# Patient Record
Sex: Female | Born: 1950
Health system: Southern US, Community
[De-identification: ages and names within clinical notes are randomized; demographics above are authoritative.]

## PROBLEM LIST (undated history)

## (undated) DIAGNOSIS — M1990 Unspecified osteoarthritis, unspecified site: Secondary | ICD-10-CM

## (undated) DIAGNOSIS — I10 Essential (primary) hypertension: Secondary | ICD-10-CM

## (undated) DIAGNOSIS — Z923 Personal history of irradiation: Secondary | ICD-10-CM

## (undated) HISTORY — PX: ABDOMINAL HYSTERECTOMY: SHX81

## (undated) HISTORY — PX: CHOLECYSTECTOMY: SHX55

---

## 1998-09-07 ENCOUNTER — Other Ambulatory Visit: Admission: RE | Admit: 1998-09-07 | Discharge: 1998-09-07 | Payer: Self-pay | Admitting: Obstetrics and Gynecology

## 1999-08-16 ENCOUNTER — Encounter: Payer: Self-pay | Admitting: Internal Medicine

## 1999-08-16 ENCOUNTER — Encounter: Admission: RE | Admit: 1999-08-16 | Discharge: 1999-08-16 | Payer: Self-pay | Admitting: Internal Medicine

## 1999-09-10 ENCOUNTER — Other Ambulatory Visit: Admission: RE | Admit: 1999-09-10 | Discharge: 1999-09-10 | Payer: Self-pay | Admitting: Obstetrics and Gynecology

## 2000-10-02 ENCOUNTER — Other Ambulatory Visit: Admission: RE | Admit: 2000-10-02 | Discharge: 2000-10-02 | Payer: Self-pay | Admitting: Obstetrics and Gynecology

## 2000-10-08 ENCOUNTER — Encounter: Payer: Self-pay | Admitting: Obstetrics and Gynecology

## 2000-10-08 ENCOUNTER — Encounter: Admission: RE | Admit: 2000-10-08 | Discharge: 2000-10-08 | Payer: Self-pay | Admitting: Obstetrics and Gynecology

## 2001-12-21 ENCOUNTER — Encounter: Payer: Self-pay | Admitting: Obstetrics and Gynecology

## 2001-12-21 ENCOUNTER — Encounter: Admission: RE | Admit: 2001-12-21 | Discharge: 2001-12-21 | Payer: Self-pay | Admitting: Obstetrics and Gynecology

## 2002-01-20 ENCOUNTER — Encounter: Admission: RE | Admit: 2002-01-20 | Discharge: 2002-01-20 | Payer: Self-pay | Admitting: Internal Medicine

## 2003-01-25 ENCOUNTER — Other Ambulatory Visit: Admission: RE | Admit: 2003-01-25 | Discharge: 2003-01-25 | Payer: Self-pay | Admitting: Obstetrics and Gynecology

## 2003-05-24 ENCOUNTER — Encounter: Payer: Self-pay | Admitting: Obstetrics and Gynecology

## 2003-05-24 ENCOUNTER — Ambulatory Visit (HOSPITAL_COMMUNITY): Admission: RE | Admit: 2003-05-24 | Discharge: 2003-05-24 | Payer: Self-pay | Admitting: Obstetrics and Gynecology

## 2004-02-02 ENCOUNTER — Other Ambulatory Visit: Admission: RE | Admit: 2004-02-02 | Discharge: 2004-02-02 | Payer: Self-pay | Admitting: Obstetrics and Gynecology

## 2004-02-08 ENCOUNTER — Ambulatory Visit (HOSPITAL_COMMUNITY): Admission: RE | Admit: 2004-02-08 | Discharge: 2004-02-08 | Payer: Self-pay | Admitting: Obstetrics and Gynecology

## 2005-02-06 ENCOUNTER — Other Ambulatory Visit: Admission: RE | Admit: 2005-02-06 | Discharge: 2005-02-06 | Payer: Self-pay | Admitting: Obstetrics and Gynecology

## 2009-10-05 ENCOUNTER — Emergency Department (HOSPITAL_COMMUNITY): Admission: EM | Admit: 2009-10-05 | Discharge: 2009-10-05 | Payer: Self-pay | Admitting: Emergency Medicine

## 2010-05-30 ENCOUNTER — Encounter: Admission: RE | Admit: 2010-05-30 | Discharge: 2010-05-30 | Payer: Self-pay | Admitting: Otolaryngology

## 2010-11-10 ENCOUNTER — Encounter: Payer: Self-pay | Admitting: Obstetrics and Gynecology

## 2011-01-13 ENCOUNTER — Other Ambulatory Visit: Payer: Self-pay | Admitting: Internal Medicine

## 2011-01-13 DIAGNOSIS — R634 Abnormal weight loss: Secondary | ICD-10-CM

## 2011-01-13 DIAGNOSIS — R102 Pelvic and perineal pain: Secondary | ICD-10-CM

## 2011-01-14 ENCOUNTER — Ambulatory Visit
Admission: RE | Admit: 2011-01-14 | Discharge: 2011-01-14 | Disposition: A | Discharge status: Home / Self Care | Payer: BC Managed Care – PPO | Source: Ambulatory Visit | Attending: Internal Medicine | Admitting: Internal Medicine

## 2011-01-14 DIAGNOSIS — R634 Abnormal weight loss: Secondary | ICD-10-CM

## 2011-01-14 DIAGNOSIS — R102 Pelvic and perineal pain: Secondary | ICD-10-CM

## 2011-01-14 MED ORDER — IOHEXOL 300 MG/ML  SOLN
100.0000 mL | Freq: Once | INTRAMUSCULAR | Status: AC | PRN
  Administered 2011-01-14: 100 mL via INTRAVENOUS

## 2011-02-02 ENCOUNTER — Other Ambulatory Visit: Payer: Self-pay | Admitting: General Surgery

## 2011-02-03 ENCOUNTER — Ambulatory Visit (HOSPITAL_COMMUNITY): Payer: BC Managed Care – PPO

## 2011-02-03 ENCOUNTER — Ambulatory Visit (HOSPITAL_COMMUNITY)
Admission: RE | Admit: 2011-02-03 | Discharge: 2011-02-03 | Disposition: A | Discharge status: Home / Self Care | Payer: BC Managed Care – PPO | Source: Ambulatory Visit | Attending: General Surgery | Admitting: General Surgery

## 2011-02-03 DIAGNOSIS — M069 Rheumatoid arthritis, unspecified: Secondary | ICD-10-CM | POA: Insufficient documentation

## 2011-02-03 DIAGNOSIS — Z79899 Other long term (current) drug therapy: Secondary | ICD-10-CM | POA: Insufficient documentation

## 2011-02-03 DIAGNOSIS — K8010 Calculus of gallbladder with chronic cholecystitis without obstruction: Secondary | ICD-10-CM | POA: Insufficient documentation

## 2011-02-03 DIAGNOSIS — D72819 Decreased white blood cell count, unspecified: Secondary | ICD-10-CM | POA: Insufficient documentation

## 2011-02-03 DIAGNOSIS — D638 Anemia in other chronic diseases classified elsewhere: Secondary | ICD-10-CM | POA: Insufficient documentation

## 2011-02-03 DIAGNOSIS — IMO0002 Reserved for concepts with insufficient information to code with codable children: Secondary | ICD-10-CM | POA: Insufficient documentation

## 2011-02-03 LAB — DIFFERENTIAL
Basophils Absolute: 0 10*3/uL (ref 0.0–0.1)
Basophils Relative: 0 % (ref 0–1)
Monocytes Relative: 5 % (ref 3–12)
Neutro Abs: 4.4 10*3/uL (ref 1.7–7.7)
Neutrophils Relative %: 63 % (ref 43–77)

## 2011-02-03 LAB — CBC
Hemoglobin: 12.6 g/dL (ref 12.0–15.0)
RBC: 3.91 MIL/uL (ref 3.87–5.11)
WBC: 6.9 10*3/uL (ref 4.0–10.5)

## 2011-02-03 LAB — COMPREHENSIVE METABOLIC PANEL
Albumin: 3.7 g/dL (ref 3.5–5.2)
Alkaline Phosphatase: 72 U/L (ref 39–117)
BUN: 13 mg/dL (ref 6–23)
Calcium: 9.5 mg/dL (ref 8.4–10.5)
Creatinine, Ser: 0.74 mg/dL (ref 0.4–1.2)
Glucose, Bld: 123 mg/dL — ABNORMAL HIGH (ref 70–99)
Potassium: 4.8 mEq/L (ref 3.5–5.1)
Total Protein: 8.1 g/dL (ref 6.0–8.3)

## 2011-02-03 LAB — SURGICAL PCR SCREEN
MRSA, PCR: NEGATIVE
Staphylococcus aureus: NEGATIVE

## 2011-02-11 NOTE — Op Note (Signed)
Heather Roberson, FAETH            ACCOUNT NO.:  [id number]  MEDICAL RECORD NO.:  [id number]           PATIENT TYPE:  O  LOCATION:  SDSC                         FACILITY:  [facility]  PHYSICIAN:  Ollen Gross. Vernell Morgans, M.D. DATE OF BIRTH:  1951-10-11  DATE OF PROCEDURE:  02/03/2011 DATE OF DISCHARGE:                              OPERATIVE REPORT   PREOPERATIVE DIAGNOSIS:  Gallstones.  POSTOPERATIVE DIAGNOSIS:  Gallstones.  PROCEDURE:  Laparoscopic cholecystectomy with intraoperative cholangiogram.  SURGEON:  Ollen Gross. Vernell Morgans, MD  ASSISTANT:  Anselm Pancoast. Zachery Dakins, MD  ANESTHESIA:  General endotracheal.  PROCEDURE:  After informed consent was obtained, the patient was brought to the operating room and placed in supine position on the operating room table.  After induction of general anesthesia, the patient's abdomen was prepped with ChloraPrep, allowed to dry and draped in usual sterile manner.  The area below the umbilicus was infiltrated with 0.25% Marcaine.  A small incision was made with a 15 blade knife.  This incision was carried down through the subcutaneous tissue bluntly with a hemostat and [employer] retractors until the linea alba was identified. The linea alba was incised with a 15 blade knife and each side was grasped with Kocher clamps and elevated anteriorly.  The preperitoneal space was then probed bluntly with a hemostat until the peritoneum was opened.  Next, access was gained into the abdominal cavity.  A 0 Vicryl pursestring stitch was placed in the fascia around the opening, a Hasson cannula was placed through the opening and anchored in place with a previously placed Vicryl pursestring stitch.  The abdomen was then insufflated with carbon dioxide without difficulty.  The patient was placed in reverse Trendelenburg position and rotated slightly with the right side up.  Next, the epigastric region was infiltrated with 0.25% Marcaine.  A small incision was made with  a 15 blade knife.  A 10-mm port was placed bluntly through this incision into the abdominal cavity under direct vision.  Sites were then chosen laterally on the right side of the abdomen with placement of 5-mm ports.  Each of this area was then infiltrated with 0.25% Marcaine.  Small stab incisions were made with a 15 blade knife.  5 mm ports were then placed bluntly through these incisions into the abdominal cavity under direct vision without difficulty.  Blunt grasper was placed to the lateral-most 5-mm port and used to grasp the dome of the gallbladder and elevated anteriorly and superiorly.  Another blunt grasper was placed through the other 5-mm port and used to retract on the body and neck of the gallbladder.  A dissector was placed through the epigastric port.  The peritoneal reflection at the gallbladder neck was opened with the electrocautery. Blunt dissection was then carried out in this area until the gallbladder neck and cystic duct junction was readily identified and a good window was created.  A single clip was placed on the gallbladder neck.  A small ductotomy was made just below the clip with laparoscopic scissors.  A 14- gauge Angiocath was placed percutaneously through the anterior abdominal wall under direct vision.  A Reddick cholangiogram catheter  was placed through the Angiocath and flushed.  The Reddick catheter was then placed within the cystic duct and anchored in place with a clip.  A cholangiogram was obtained that showed no filling defects, good emptying in duodenum and adequate length on the cystic duct.  The anchoring clip and the catheters were removed from the patient.  Three clips were placed proximally on the cystic duct and duct was divided between the two sets of clips.  Posteriorly, the cystic artery was identified and again dissected bluntly in circumferential manner until a good window was created.  Two clips were placed proximally and one distally in  the artery and the artery was divided between the two.  Next, a laparoscopic hook cautery device was used to separate the gallbladder from the liver bed.  Prior to completely detaching the gallbladder from the liver bed, the liver bed was inspected and several small bleeding points were coagulated with electrocautery until the area was completely hemostatic. Gallbladder was then detached from rest away from the liver bed without difficulty.  A laparoscopic bag was inserted through the epigastric port.  The gallbladder was placed in the bag and the bag was sealed. The abdomen was then irrigated with copious amounts of saline until the effluent was clear.  The liver bed was inspected again and found to be hemostatic.  The laparoscope was then moved from the epigastric port.  A gallbladder grasper was placed through the Hasson cannula and used to grasp the opening of the bag.  The bag with the gallbladder was removed with the Hasson cannula through the infraumbilical port without difficulty.  The fascial defect was closed with preplaced Vicryl pursestring stitch as well as with another figure-of-eight 0 Vicryl stitch.  The rest of the ports were removed under direct vision and were found to be hemostatic.  Gas was allowed to escape.  The skin incisions were all closed with interrupted 4-0  Monocryl subcuticular stitches and Dermabond dressings were applied.  The patient tolerated the procedure well.  At the end of the case, all needle, sponge and instrument counts were correct.  The patient was then awakened and taken to the recovery room in stable condition.     Ollen Gross. Vernell Morgans, M.D.     PST/MEDQ  D:  02/03/2011  T:  02/03/2011  Job:  [id number]  Electronically Signed by Chevis Pretty III M.D. on 02/11/2011 09:30:32 AM

## 2011-02-18 ENCOUNTER — Other Ambulatory Visit: Payer: Self-pay | Admitting: General Surgery

## 2011-02-18 ENCOUNTER — Ambulatory Visit
Admission: RE | Admit: 2011-02-18 | Discharge: 2011-02-18 | Disposition: A | Discharge status: Home / Self Care | Payer: BC Managed Care – PPO | Source: Ambulatory Visit | Attending: General Surgery | Admitting: General Surgery

## 2011-02-18 DIAGNOSIS — R0602 Shortness of breath: Secondary | ICD-10-CM

## 2013-11-15 ENCOUNTER — Other Ambulatory Visit: Payer: Self-pay | Admitting: Nurse Practitioner

## 2013-11-15 ENCOUNTER — Ambulatory Visit
Admission: RE | Admit: 2013-11-15 | Discharge: 2013-11-15 | Disposition: A | Discharge status: Home / Self Care | Payer: BC Managed Care – PPO | Source: Ambulatory Visit | Attending: Nurse Practitioner | Admitting: Nurse Practitioner

## 2013-11-15 DIAGNOSIS — R634 Abnormal weight loss: Secondary | ICD-10-CM

## 2013-11-17 ENCOUNTER — Other Ambulatory Visit: Payer: Self-pay | Admitting: Nurse Practitioner

## 2013-11-17 DIAGNOSIS — R413 Other amnesia: Secondary | ICD-10-CM

## 2013-11-21 ENCOUNTER — Ambulatory Visit
Admission: RE | Admit: 2013-11-21 | Discharge: 2013-11-21 | Disposition: A | Discharge status: Home / Self Care | Payer: BC Managed Care – PPO | Source: Ambulatory Visit | Attending: Nurse Practitioner | Admitting: Nurse Practitioner

## 2013-11-21 ENCOUNTER — Other Ambulatory Visit: Payer: BC Managed Care – PPO

## 2013-11-21 DIAGNOSIS — R413 Other amnesia: Secondary | ICD-10-CM

## 2013-11-21 MED ORDER — GADOBENATE DIMEGLUMINE 529 MG/ML IV SOLN
9.0000 mL | Freq: Once | INTRAVENOUS | Status: AC | PRN
  Administered 2013-11-21: 9 mL via INTRAVENOUS

## 2014-12-14 ENCOUNTER — Other Ambulatory Visit: Payer: Self-pay | Admitting: Internal Medicine

## 2014-12-14 DIAGNOSIS — R29810 Facial weakness: Secondary | ICD-10-CM

## 2015-01-01 ENCOUNTER — Other Ambulatory Visit: Payer: Self-pay | Admitting: Obstetrics and Gynecology

## 2015-01-02 LAB — CYTOLOGY - PAP

## 2016-03-27 ENCOUNTER — Emergency Department (HOSPITAL_COMMUNITY): Payer: BLUE CROSS/BLUE SHIELD

## 2016-03-27 ENCOUNTER — Emergency Department (HOSPITAL_COMMUNITY)
Admission: EM | Admit: 2016-03-27 | Discharge: 2016-03-27 | Disposition: A | Discharge status: Home / Self Care | Payer: BLUE CROSS/BLUE SHIELD | Attending: Emergency Medicine | Admitting: Emergency Medicine

## 2016-03-27 ENCOUNTER — Encounter (HOSPITAL_COMMUNITY): Payer: Self-pay | Admitting: Family Medicine

## 2016-03-27 DIAGNOSIS — Z79899 Other long term (current) drug therapy: Secondary | ICD-10-CM | POA: Insufficient documentation

## 2016-03-27 DIAGNOSIS — R42 Dizziness and giddiness: Secondary | ICD-10-CM | POA: Diagnosis present

## 2016-03-27 HISTORY — DX: Unspecified osteoarthritis, unspecified site: M19.90

## 2016-03-27 LAB — I-STAT TROPONIN, ED: TROPONIN I, POC: 0 ng/mL (ref 0.00–0.08)

## 2016-03-27 LAB — URINE MICROSCOPIC-ADD ON: RBC / HPF: NONE SEEN RBC/hpf (ref 0–5)

## 2016-03-27 LAB — CBC
HCT: 39.2 % (ref 36.0–46.0)
HEMOGLOBIN: 13 g/dL (ref 12.0–15.0)
MCH: 31.3 pg (ref 26.0–34.0)
MCHC: 33.2 g/dL (ref 30.0–36.0)
MCV: 94.2 fL (ref 78.0–100.0)
Platelets: 184 10*3/uL (ref 150–400)
RBC: 4.16 MIL/uL (ref 3.87–5.11)
RDW: 12.3 % (ref 11.5–15.5)
WBC: 4.9 10*3/uL (ref 4.0–10.5)

## 2016-03-27 LAB — BASIC METABOLIC PANEL
ANION GAP: 8 (ref 5–15)
BUN: 10 mg/dL (ref 6–20)
CALCIUM: 9.3 mg/dL (ref 8.9–10.3)
CO2: 25 mmol/L (ref 22–32)
CREATININE: 0.81 mg/dL (ref 0.44–1.00)
Chloride: 102 mmol/L (ref 101–111)
Glucose, Bld: 121 mg/dL — ABNORMAL HIGH (ref 65–99)
Potassium: 3.5 mmol/L (ref 3.5–5.1)
SODIUM: 135 mmol/L (ref 135–145)

## 2016-03-27 LAB — URINALYSIS, ROUTINE W REFLEX MICROSCOPIC
BILIRUBIN URINE: NEGATIVE
Glucose, UA: NEGATIVE mg/dL
HGB URINE DIPSTICK: NEGATIVE
Ketones, ur: NEGATIVE mg/dL
Nitrite: NEGATIVE
PH: 7 (ref 5.0–8.0)
Protein, ur: NEGATIVE mg/dL
SPECIFIC GRAVITY, URINE: 1.003 — AB (ref 1.005–1.030)

## 2016-03-27 MED ORDER — NYSTATIN 100000 UNIT/ML MT SUSP: 500000.0000 [IU] | Freq: Four times a day (QID) | OROMUCOSAL | Status: AC

## 2016-03-27 NOTE — ED Notes (Signed)
Pt eating fish plate in triage

## 2016-03-27 NOTE — ED Notes (Signed)
Pt here for dizziness, lightheadedness, vomiting that started this am. sts started all of a sudden. No other deficits. sts she has had some improvement. Denies dizziness now. sts that she hasn't had anything to eat this am.

## 2016-03-27 NOTE — Discharge Instructions (Signed)
Ms. Heather Roberson,  Nice meeting you! Please follow-up with your primary care provider. Return to the emergency department if you develop fevers, chills, chest pain, abdominal pain, nausea/vomiting, new/worsening symptoms. Feel better soon!  S. Wendie Simmer, PA-C Dizziness Dizziness is a common problem. It is a feeling of unsteadiness or light-headedness. You may feel like you are about to faint. Dizziness can lead to injury if you stumble or fall. Anyone can become dizzy, but dizziness is more common in older adults. This condition can be caused by a number of things, including medicines, dehydration, or illness. HOME CARE INSTRUCTIONS Taking these steps may help with your condition: Eating and Drinking  Drink enough fluid to keep your urine clear or pale yellow. This helps to keep you from becoming dehydrated. Try to drink more clear fluids, such as water.  Do not drink alcohol.  Limit your caffeine intake if directed by your health care provider.  Limit your salt intake if directed by your health care provider. Activity  Avoid making quick movements.  Rise slowly from chairs and steady yourself until you feel okay.  In the morning, first sit up on the side of the bed. When you feel okay, stand slowly while you hold onto something until you know that your balance is fine.  Move your legs often if you need to stand in one place for a long time. Tighten and relax your muscles in your legs while you are standing.  Do not drive or operate heavy machinery if you feel dizzy.  Avoid bending down if you feel dizzy. Place items in your home so that they are easy for you to reach without leaning over. Lifestyle  Do not use any tobacco products, including cigarettes, chewing tobacco, or electronic cigarettes. If you need help quitting, ask your health care provider.  Try to reduce your stress level, such as with yoga or meditation. Talk with your health care provider if you need  help. General Instructions  Watch your dizziness for any changes.  Take medicines only as directed by your health care provider. Talk with your health care provider if you think that your dizziness is caused by a medicine that you are taking.  Tell a friend or a family member that you are feeling dizzy. If he or she notices any changes in your behavior, have this person call your health care provider.  Keep all follow-up visits as directed by your health care provider. This is important. SEEK MEDICAL CARE IF:  Your dizziness does not go away.  Your dizziness or light-headedness gets worse.  You feel nauseous.  You have reduced hearing.  You have new symptoms.  You are unsteady on your feet or you feel like the room is spinning. SEEK IMMEDIATE MEDICAL CARE IF:  You vomit or have diarrhea and are unable to eat or drink anything.  You have problems talking, walking, swallowing, or using your arms, hands, or legs.  You feel generally weak.  You are not thinking clearly or you have trouble forming sentences. It may take a friend or family member to notice this.  You have chest pain, abdominal pain, shortness of breath, or sweating.  Your vision changes.  You notice any bleeding.  You have a headache.  You have neck pain or a stiff neck.  You have a fever.   This information is not intended to replace advice given to you by your health care provider. Make sure you discuss any questions you have with your  health care provider.   Document Released: 04/01/2001 Document Revised: 02/20/2015 Document Reviewed: 10/02/2014 Elsevier Interactive Patient Education [employer].

## 2016-03-27 NOTE — ED Notes (Signed)
Pt is in restroom 

## 2016-03-27 NOTE — ED Provider Notes (Signed)
Medical screening examination/treatment/procedure(s) were conducted as a shared visit with non-physician practitioner(s) and myself.  I personally evaluated the patient during the encounter.   EKG Interpretation   Date/Time:  Thursday March 27 2016 12:06:57 EDT Ventricular Rate:  83 PR Interval:  170 QRS Duration: 94 QT Interval:  440 QTC Calculation: 517 R Axis:   83 Text Interpretation:  Normal sinus rhythm Moderate voltage criteria for  LVH, may be normal variant Nonspecific T wave abnormality Abnormal ECG No  significant change since last tracing Confirmed by Timiya Howells  MD, Aubrina Nieman  ([id number]) on 03/27/2016 6:28:20 PM     Patient here after acute onset of dizziness while walking uphill was lasted for approximately 1 hour. No associated chest pain or chest pressure. States he feels much better at this time. Denies any recent illnesses. No blood loss. Did note that she did not have any food today. Labs and x-rays reviewed. Patient stable for discharge with return precautions   Lacretia Leigh, MD 03/27/16 2009

## 2016-03-27 NOTE — ED Notes (Signed)
Phlebotomy at bedside.

## 2016-03-27 NOTE — ED Provider Notes (Signed)
CSN: [id number]     Arrival date & time 03/27/16  1137 History   First MD Initiated Contact with Patient 03/27/16 1659     Chief Complaint  Patient presents with  . Dizziness  . Emesis   HPI   ALEKSANDRIA FRIGO is a 65 y.o. female PMH significant for arthritis presenting with s/p one episode of dizziness this morning. She states the dizziness occurred while walking uphill. She states that episode lasted approximately 1 hour. She endorses nausea and one episode of vomiting prior to arrival. She ate food while in the waiting room without emesis. She describes the dizziness as nonvertiginous. She denies any shortness of breath, chest pain, abdominal pain, hematochezia, headaches, visual changes, slurred speech, weakness, dysuria. She attributes her symptoms to not eating this morning.  Past Medical History  Diagnosis Date  . Arthritis    History reviewed. No pertinent past surgical history. History reviewed. No pertinent family history. Social History  Substance Use Topics  . Smoking status: Never Smoker   . Smokeless tobacco: None  . Alcohol Use: No   OB History    No data available     Review of Systems  Ten systems are reviewed and are negative for acute change except as noted in the HPI  Allergies  Other  Home Medications   Prior to Admission medications   Medication Sig Start Date End Date Taking? Authorizing Provider  Ascorbic Acid (VITAMIN C PO) Take 1 tablet by mouth daily.   Yes Historical Provider, MD  CALCIUM PO Take 1 tablet by mouth daily.   Yes Historical Provider, MD  Carboxymethylcellul-Glycerin (CLEAR EYES FOR DRY EYES OP) Place 1-2 drops into both eyes 2 (two) times daily as needed (for dryness).   Yes Historical Provider, MD  cholecalciferol (VITAMIN D) 1000 units tablet Take 1,000 Units by mouth daily.   Yes Historical Provider, MD  Cyanocobalamin (B-12 PO) Take 1 tablet by mouth daily.   Yes Historical Provider, MD  ferrous sulfate 325 (65 FE) MG tablet  Take 325 mg by mouth daily with breakfast.   Yes Historical Provider, MD  fluticasone (FLONASE) 50 MCG/ACT nasal spray Place 1-2 sprays into both nostrils daily as needed for allergies or rhinitis.   Yes Historical Provider, MD  HUMIRA PEN 40 MG/0.8ML PNKT One injection every other week 02/29/16  Yes Historical Provider, MD  hydroxychloroquine (PLAQUENIL) 200 MG tablet Take 2 tablets by mouth every morning. 02/05/16  Yes Historical Provider, MD  Multiple Vitamins-Minerals (ONE-A-DAY WOMENS 50 PLUS PO) Take 1 tablet by mouth daily after breakfast.   Yes Historical Provider, MD   BP 144/91 mmHg  Pulse 81  Temp(Src) 98.7 F (37.1 C) (Oral)  Resp 17  SpO2 100% Physical Exam  Constitutional: She is oriented to person, place, and time. She appears well-developed and well-nourished. No distress.  HENT:  Head: Normocephalic and atraumatic.  Right Ear: External ear normal.  Left Ear: External ear normal.  Nose: Nose normal.  Mouth/Throat: Oropharynx is clear and moist. No oropharyngeal exudate.  TMs normal bilaterally.  Eyes: Conjunctivae are normal. Pupils are equal, round, and reactive to light. Right eye exhibits no discharge. Left eye exhibits no discharge. No scleral icterus.  Neck: No tracheal deviation present.  Cardiovascular: Normal rate, regular rhythm, normal heart sounds and intact distal pulses.  Exam reveals no gallop and no friction rub.   No murmur heard. Pulmonary/Chest: Effort normal and breath sounds normal. No respiratory distress. She has no wheezes. She has no rales.  She exhibits no tenderness.  Abdominal: Soft. Bowel sounds are normal. She exhibits no distension and no mass. There is no tenderness. There is no rebound and no guarding.  Musculoskeletal: Normal range of motion. She exhibits no edema or tenderness.  Lymphadenopathy:    She has no cervical adenopathy.  Neurological: She is alert and oriented to person, place, and time. Coordination normal.  Cranial nerves II  through XII grossly intact.  Skin: Skin is warm and dry. No rash noted. She is not diaphoretic. No erythema.  Psychiatric: She has a normal mood and affect. Her behavior is normal.  Nursing note and vitals reviewed.   ED Course  Procedures  Labs Review Labs Reviewed  BASIC METABOLIC PANEL - Abnormal; Notable for the following:    Glucose, Bld 121 (*)    All other components within normal limits  URINALYSIS, ROUTINE W REFLEX MICROSCOPIC (NOT AT [facility]) - Abnormal; Notable for the following:    Specific Gravity, Urine 1.003 (*)    Leukocytes, UA SMALL (*)    All other components within normal limits  URINE MICROSCOPIC-ADD ON - Abnormal; Notable for the following:    Squamous Epithelial / LPF 0-5 (*)    Bacteria, UA RARE (*)    All other components within normal limits  CBC  I-STAT TROPOININ, ED    Imaging Review Dg Chest 2 View  03/27/2016  CLINICAL DATA:  65 year old female with dizziness EXAM: CHEST  2 VIEW COMPARISON:  Chest radiograph dated 11/15/2013 FINDINGS: The heart size and mediastinal contours are within normal limits. Both lungs are clear. The visualized skeletal structures are unremarkable. IMPRESSION: No active cardiopulmonary disease. Electronically Signed   By: Anner Crete M.D.   On: 03/27/2016 18:25   Ct Head Wo Contrast  03/27/2016  CLINICAL DATA:  Sudden onset of dizziness this morning, lightheadedness and vomiting. EXAM: CT HEAD WITHOUT CONTRAST TECHNIQUE: Contiguous axial images were obtained from the base of the skull through the vertex without intravenous contrast. COMPARISON:  Brain MRI dated 11/21/2013. FINDINGS: Brain: Ventricles are normal in size and configuration. Mild chronic small vessel ischemic change again noted within the deep periventricular white matter regions bilaterally. All other areas of the brain demonstrate normal gray-white matter attenuation. There is no mass, hemorrhage, edema or other evidence of acute parenchymal abnormality. No  extra-axial hemorrhage. Vascular: No hyperdense vessel or unexpected calcification. Skull: Negative for fracture or focal lesion. Sinuses/Orbits: No acute findings. Visualized upper paranasal sinuses are clear. Other: None. IMPRESSION: No acute findings. No intracranial mass, hemorrhage or edema. Mild chronic small vessel ischemic change within the deep periventricular white matter. Electronically Signed   By: Franki Cabot M.D.   On: 03/27/2016 18:41   I have personally reviewed and evaluated these images and lab results as part of my medical decision-making.   EKG Interpretation   Date/Time:  Thursday March 27 2016 12:06:57 EDT Ventricular Rate:  83 PR Interval:  170 QRS Duration: 94 QT Interval:  440 QTC Calculation: 517 R Axis:   83 Text Interpretation:  Normal sinus rhythm Moderate voltage criteria for  LVH, may be normal variant Nonspecific T wave abnormality Abnormal ECG No  significant change since last tracing Confirmed by ALLEN  MD, ANTHONY  ([id number]) on 03/27/2016 6:28:20 PM      MDM   Final diagnoses:  Dizziness   Troponin, UA, BMP, CBC, CT head, chest x-ray, EKG unremarkable for acute change. Patient was asymptomatic during encounter. She feels better and is requesting to go home. Patient  may be safely discharged home. Discussed reasons for return. Patient to follow-up with primary care provider within one week. Patient in understanding and agreement with the plan.  Dr. Zenia Resides evaluated patient as well and agrees with above plan.   Clitherall Lions, [place] 04/05/16 [phone]

## 2016-03-27 NOTE — ED Notes (Signed)
Pt reports she feels much better

## 2016-06-06 ENCOUNTER — Encounter: Payer: BLUE CROSS/BLUE SHIELD | Admitting: Podiatry

## 2016-06-19 NOTE — Progress Notes (Signed)
This encounter was created in error - please disregard.

## 2016-06-20 ENCOUNTER — Ambulatory Visit (INDEPENDENT_AMBULATORY_CARE_PROVIDER_SITE_OTHER): Payer: BLUE CROSS/BLUE SHIELD

## 2016-06-20 ENCOUNTER — Encounter: Payer: Self-pay | Admitting: Podiatry

## 2016-06-20 ENCOUNTER — Ambulatory Visit (INDEPENDENT_AMBULATORY_CARE_PROVIDER_SITE_OTHER): Payer: BLUE CROSS/BLUE SHIELD | Admitting: Podiatry

## 2016-06-20 VITALS — BP 119/83 | HR 96 | Resp 16 | Ht 63.0 in | Wt 116.0 lb

## 2016-06-20 DIAGNOSIS — M79672 Pain in left foot: Secondary | ICD-10-CM

## 2016-06-20 DIAGNOSIS — D3610 Benign neoplasm of peripheral nerves and autonomic nervous system, unspecified: Secondary | ICD-10-CM

## 2016-06-20 DIAGNOSIS — M79671 Pain in right foot: Secondary | ICD-10-CM | POA: Diagnosis not present

## 2016-06-20 DIAGNOSIS — M779 Enthesopathy, unspecified: Secondary | ICD-10-CM

## 2016-06-20 DIAGNOSIS — G5761 Lesion of plantar nerve, right lower limb: Secondary | ICD-10-CM | POA: Diagnosis not present

## 2016-06-20 NOTE — Progress Notes (Signed)
   Subjective:    Patient ID: Heather Roberson, female    DOB: 1951-04-29, 65 y.o.   MRN: [id number]  HPI    Review of Systems  HENT: Positive for sinus pressure.   Respiratory: Positive for cough.   All other systems reviewed and are negative.      Objective:   Physical Exam        Assessment & Plan:

## 2016-06-20 NOTE — Progress Notes (Signed)
Subjective:     Patient ID: Heather Roberson, female   DOB: 1951-04-12, 65 y.o.   MRN: [id number]  HPI patient presents stating I have pain in both my feet long-term history of rheumatoid arthritis and shooting pains third interspace right over left. States that this is been going on and seems to be getting gradually worse over time   Review of Systems  All other systems reviewed and are negative.      Objective:   Physical Exam  Constitutional: She is oriented to person, place, and time.  Cardiovascular: Intact distal pulses.   Musculoskeletal: Normal range of motion.  Neurological: She is oriented to person, place, and time.  Skin: Skin is warm.  Nursing note and vitals reviewed.  neurovascular status intact muscle strength adequate range of motion within normal limits with patient noted to have thin fat pad with keratotic lesion formation and discomfort with exquisite pain third interspace right over left foot with shooting pains into the adjacent digits. Patient's noted to have good digital perfusion and is well oriented 3     Assessment:     Possibility for neuroma symptomatology right along with arthritis and keratotic lesion formation    Plan:     H&P x-rays reviewed and were to try to focus on the neuroma pain that the patient's experiencing I did a proximal nerve block right then did a purified alcohol solution treatment consisting of 4% alcohol along with Marcaine. Tolerated well and reappoint to recheck  X-ray report indicates that there is some pressure around the metatarsal bones but localized

## 2016-06-20 NOTE — Progress Notes (Signed)
Chief Complaint  Patient presents with  . Foot Pain    shooting / bilateral great toes and plantar forefoot off and on

## 2016-07-18 ENCOUNTER — Encounter: Payer: Self-pay | Admitting: Podiatry

## 2016-07-18 ENCOUNTER — Ambulatory Visit (INDEPENDENT_AMBULATORY_CARE_PROVIDER_SITE_OTHER): Payer: BLUE CROSS/BLUE SHIELD | Admitting: Podiatry

## 2016-07-18 DIAGNOSIS — M79671 Pain in right foot: Secondary | ICD-10-CM | POA: Diagnosis not present

## 2016-07-18 DIAGNOSIS — D3610 Benign neoplasm of peripheral nerves and autonomic nervous system, unspecified: Secondary | ICD-10-CM | POA: Diagnosis not present

## 2016-07-18 DIAGNOSIS — M779 Enthesopathy, unspecified: Secondary | ICD-10-CM | POA: Diagnosis not present

## 2016-07-18 DIAGNOSIS — L84 Corns and callosities: Secondary | ICD-10-CM

## 2016-07-18 DIAGNOSIS — M79672 Pain in left foot: Secondary | ICD-10-CM | POA: Diagnosis not present

## 2016-07-18 NOTE — Progress Notes (Signed)
Subjective:     Patient ID: Heather Roberson, female   DOB: Nov 20, 1950, 65 y.o.   MRN: [id number]  HPI patient presents stating my foot is feeling fine with diminished discomfort and I did lesion left   Review of Systems     Objective:   Physical Exam Neurovascular status intact with patient found to have good healing sites with response to neuro lysis injection was excellent with patient noted to have keratotic lesion formation    Assessment:     Neuroma symptomatology improved with lesion formation bilateral    Plan:     Reviewed condition and recommended that she continue wider shoes and I debrided lesions to reduce pressure

## 2016-12-14 DIAGNOSIS — L03011 Cellulitis of right finger: Secondary | ICD-10-CM | POA: Insufficient documentation

## 2016-12-14 DIAGNOSIS — M79644 Pain in right finger(s): Secondary | ICD-10-CM | POA: Insufficient documentation

## 2017-03-02 DIAGNOSIS — N958 Other specified menopausal and perimenopausal disorders: Secondary | ICD-10-CM | POA: Diagnosis not present

## 2017-03-02 DIAGNOSIS — Z01419 Encounter for gynecological examination (general) (routine) without abnormal findings: Secondary | ICD-10-CM | POA: Diagnosis not present

## 2017-03-02 DIAGNOSIS — M8588 Other specified disorders of bone density and structure, other site: Secondary | ICD-10-CM | POA: Diagnosis not present

## 2017-03-02 DIAGNOSIS — Z1231 Encounter for screening mammogram for malignant neoplasm of breast: Secondary | ICD-10-CM | POA: Diagnosis not present

## 2017-03-02 DIAGNOSIS — Z6822 Body mass index (BMI) 22.0-22.9, adult: Secondary | ICD-10-CM | POA: Diagnosis not present

## 2017-03-05 DIAGNOSIS — M069 Rheumatoid arthritis, unspecified: Secondary | ICD-10-CM | POA: Diagnosis not present

## 2017-03-05 DIAGNOSIS — Z79899 Other long term (current) drug therapy: Secondary | ICD-10-CM | POA: Diagnosis not present

## 2017-04-06 DIAGNOSIS — J322 Chronic ethmoidal sinusitis: Secondary | ICD-10-CM | POA: Diagnosis not present

## 2017-04-21 DIAGNOSIS — R05 Cough: Secondary | ICD-10-CM | POA: Diagnosis not present

## 2017-04-29 DIAGNOSIS — M069 Rheumatoid arthritis, unspecified: Secondary | ICD-10-CM | POA: Diagnosis not present

## 2017-04-29 DIAGNOSIS — K6289 Other specified diseases of anus and rectum: Secondary | ICD-10-CM | POA: Diagnosis not present

## 2017-04-29 DIAGNOSIS — D638 Anemia in other chronic diseases classified elsewhere: Secondary | ICD-10-CM | POA: Diagnosis not present

## 2017-04-29 DIAGNOSIS — R198 Other specified symptoms and signs involving the digestive system and abdomen: Secondary | ICD-10-CM | POA: Diagnosis not present

## 2017-05-01 DIAGNOSIS — Z23 Encounter for immunization: Secondary | ICD-10-CM | POA: Diagnosis not present

## 2017-05-01 DIAGNOSIS — Z1159 Encounter for screening for other viral diseases: Secondary | ICD-10-CM | POA: Diagnosis not present

## 2017-05-01 DIAGNOSIS — R030 Elevated blood-pressure reading, without diagnosis of hypertension: Secondary | ICD-10-CM | POA: Diagnosis not present

## 2017-05-01 DIAGNOSIS — M069 Rheumatoid arthritis, unspecified: Secondary | ICD-10-CM | POA: Diagnosis not present

## 2017-05-01 DIAGNOSIS — M8580 Other specified disorders of bone density and structure, unspecified site: Secondary | ICD-10-CM | POA: Diagnosis not present

## 2017-05-01 DIAGNOSIS — Z0000 Encounter for general adult medical examination without abnormal findings: Secondary | ICD-10-CM | POA: Diagnosis not present

## 2017-06-01 DIAGNOSIS — M359 Systemic involvement of connective tissue, unspecified: Secondary | ICD-10-CM | POA: Diagnosis not present

## 2017-06-01 DIAGNOSIS — Z79899 Other long term (current) drug therapy: Secondary | ICD-10-CM | POA: Diagnosis not present

## 2017-06-01 DIAGNOSIS — M0589 Other rheumatoid arthritis with rheumatoid factor of multiple sites: Secondary | ICD-10-CM | POA: Diagnosis not present

## 2017-06-15 DIAGNOSIS — Z1211 Encounter for screening for malignant neoplasm of colon: Secondary | ICD-10-CM | POA: Diagnosis not present

## 2017-06-15 DIAGNOSIS — K640 First degree hemorrhoids: Secondary | ICD-10-CM | POA: Diagnosis not present

## 2017-10-08 DIAGNOSIS — M0589 Other rheumatoid arthritis with rheumatoid factor of multiple sites: Secondary | ICD-10-CM | POA: Diagnosis not present

## 2017-10-08 DIAGNOSIS — Z79899 Other long term (current) drug therapy: Secondary | ICD-10-CM | POA: Diagnosis not present

## 2017-10-08 DIAGNOSIS — M359 Systemic involvement of connective tissue, unspecified: Secondary | ICD-10-CM | POA: Diagnosis not present

## 2017-11-19 DIAGNOSIS — Z79899 Other long term (current) drug therapy: Secondary | ICD-10-CM | POA: Diagnosis not present

## 2017-11-19 DIAGNOSIS — H524 Presbyopia: Secondary | ICD-10-CM | POA: Diagnosis not present

## 2017-11-19 DIAGNOSIS — H5203 Hypermetropia, bilateral: Secondary | ICD-10-CM | POA: Diagnosis not present

## 2017-11-19 DIAGNOSIS — H2513 Age-related nuclear cataract, bilateral: Secondary | ICD-10-CM | POA: Diagnosis not present

## 2017-11-19 DIAGNOSIS — M069 Rheumatoid arthritis, unspecified: Secondary | ICD-10-CM | POA: Diagnosis not present

## 2017-12-14 ENCOUNTER — Other Ambulatory Visit: Payer: Self-pay | Admitting: Internal Medicine

## 2017-12-14 ENCOUNTER — Ambulatory Visit
Admission: RE | Admit: 2017-12-14 | Discharge: 2017-12-14 | Disposition: A | Discharge status: Home / Self Care | Payer: Medicare HMO | Source: Ambulatory Visit | Attending: Internal Medicine | Admitting: Internal Medicine

## 2017-12-14 DIAGNOSIS — R05 Cough: Secondary | ICD-10-CM

## 2017-12-14 DIAGNOSIS — R053 Chronic cough: Secondary | ICD-10-CM

## 2017-12-31 DIAGNOSIS — R05 Cough: Secondary | ICD-10-CM | POA: Diagnosis not present

## 2018-02-01 DIAGNOSIS — M0589 Other rheumatoid arthritis with rheumatoid factor of multiple sites: Secondary | ICD-10-CM | POA: Diagnosis not present

## 2018-02-01 DIAGNOSIS — M79642 Pain in left hand: Secondary | ICD-10-CM | POA: Diagnosis not present

## 2018-02-01 DIAGNOSIS — Z79899 Other long term (current) drug therapy: Secondary | ICD-10-CM | POA: Diagnosis not present

## 2018-02-01 DIAGNOSIS — M359 Systemic involvement of connective tissue, unspecified: Secondary | ICD-10-CM | POA: Diagnosis not present

## 2018-03-12 DIAGNOSIS — R05 Cough: Secondary | ICD-10-CM | POA: Diagnosis not present

## 2018-03-12 DIAGNOSIS — J309 Allergic rhinitis, unspecified: Secondary | ICD-10-CM | POA: Diagnosis not present

## 2018-03-23 DIAGNOSIS — Z6822 Body mass index (BMI) 22.0-22.9, adult: Secondary | ICD-10-CM | POA: Diagnosis not present

## 2018-03-23 DIAGNOSIS — Z1231 Encounter for screening mammogram for malignant neoplasm of breast: Secondary | ICD-10-CM | POA: Diagnosis not present

## 2018-03-23 DIAGNOSIS — Z01419 Encounter for gynecological examination (general) (routine) without abnormal findings: Secondary | ICD-10-CM | POA: Diagnosis not present

## 2018-05-07 DIAGNOSIS — M25562 Pain in left knee: Secondary | ICD-10-CM | POA: Diagnosis not present

## 2018-05-07 DIAGNOSIS — M1990 Unspecified osteoarthritis, unspecified site: Secondary | ICD-10-CM | POA: Diagnosis not present

## 2018-05-07 DIAGNOSIS — M0589 Other rheumatoid arthritis with rheumatoid factor of multiple sites: Secondary | ICD-10-CM | POA: Diagnosis not present

## 2018-05-07 DIAGNOSIS — Z79899 Other long term (current) drug therapy: Secondary | ICD-10-CM | POA: Diagnosis not present

## 2018-05-07 DIAGNOSIS — M79642 Pain in left hand: Secondary | ICD-10-CM | POA: Diagnosis not present

## 2018-05-07 DIAGNOSIS — M359 Systemic involvement of connective tissue, unspecified: Secondary | ICD-10-CM | POA: Diagnosis not present

## 2018-06-03 DIAGNOSIS — Z0000 Encounter for general adult medical examination without abnormal findings: Secondary | ICD-10-CM | POA: Diagnosis not present

## 2018-06-03 DIAGNOSIS — Z1389 Encounter for screening for other disorder: Secondary | ICD-10-CM | POA: Diagnosis not present

## 2018-06-03 DIAGNOSIS — R05 Cough: Secondary | ICD-10-CM | POA: Diagnosis not present

## 2018-06-03 DIAGNOSIS — Z7189 Other specified counseling: Secondary | ICD-10-CM | POA: Diagnosis not present

## 2018-07-16 DIAGNOSIS — Z23 Encounter for immunization: Secondary | ICD-10-CM | POA: Diagnosis not present

## 2018-08-07 IMAGING — CR DG CHEST 2V
2 series · 2 of 2 positions shown · non-contrast
Comparison: 03/27/2016.

CLINICAL DATA: Chronic cough.

EXAM:
CHEST  2 VIEW

[w chest pa]
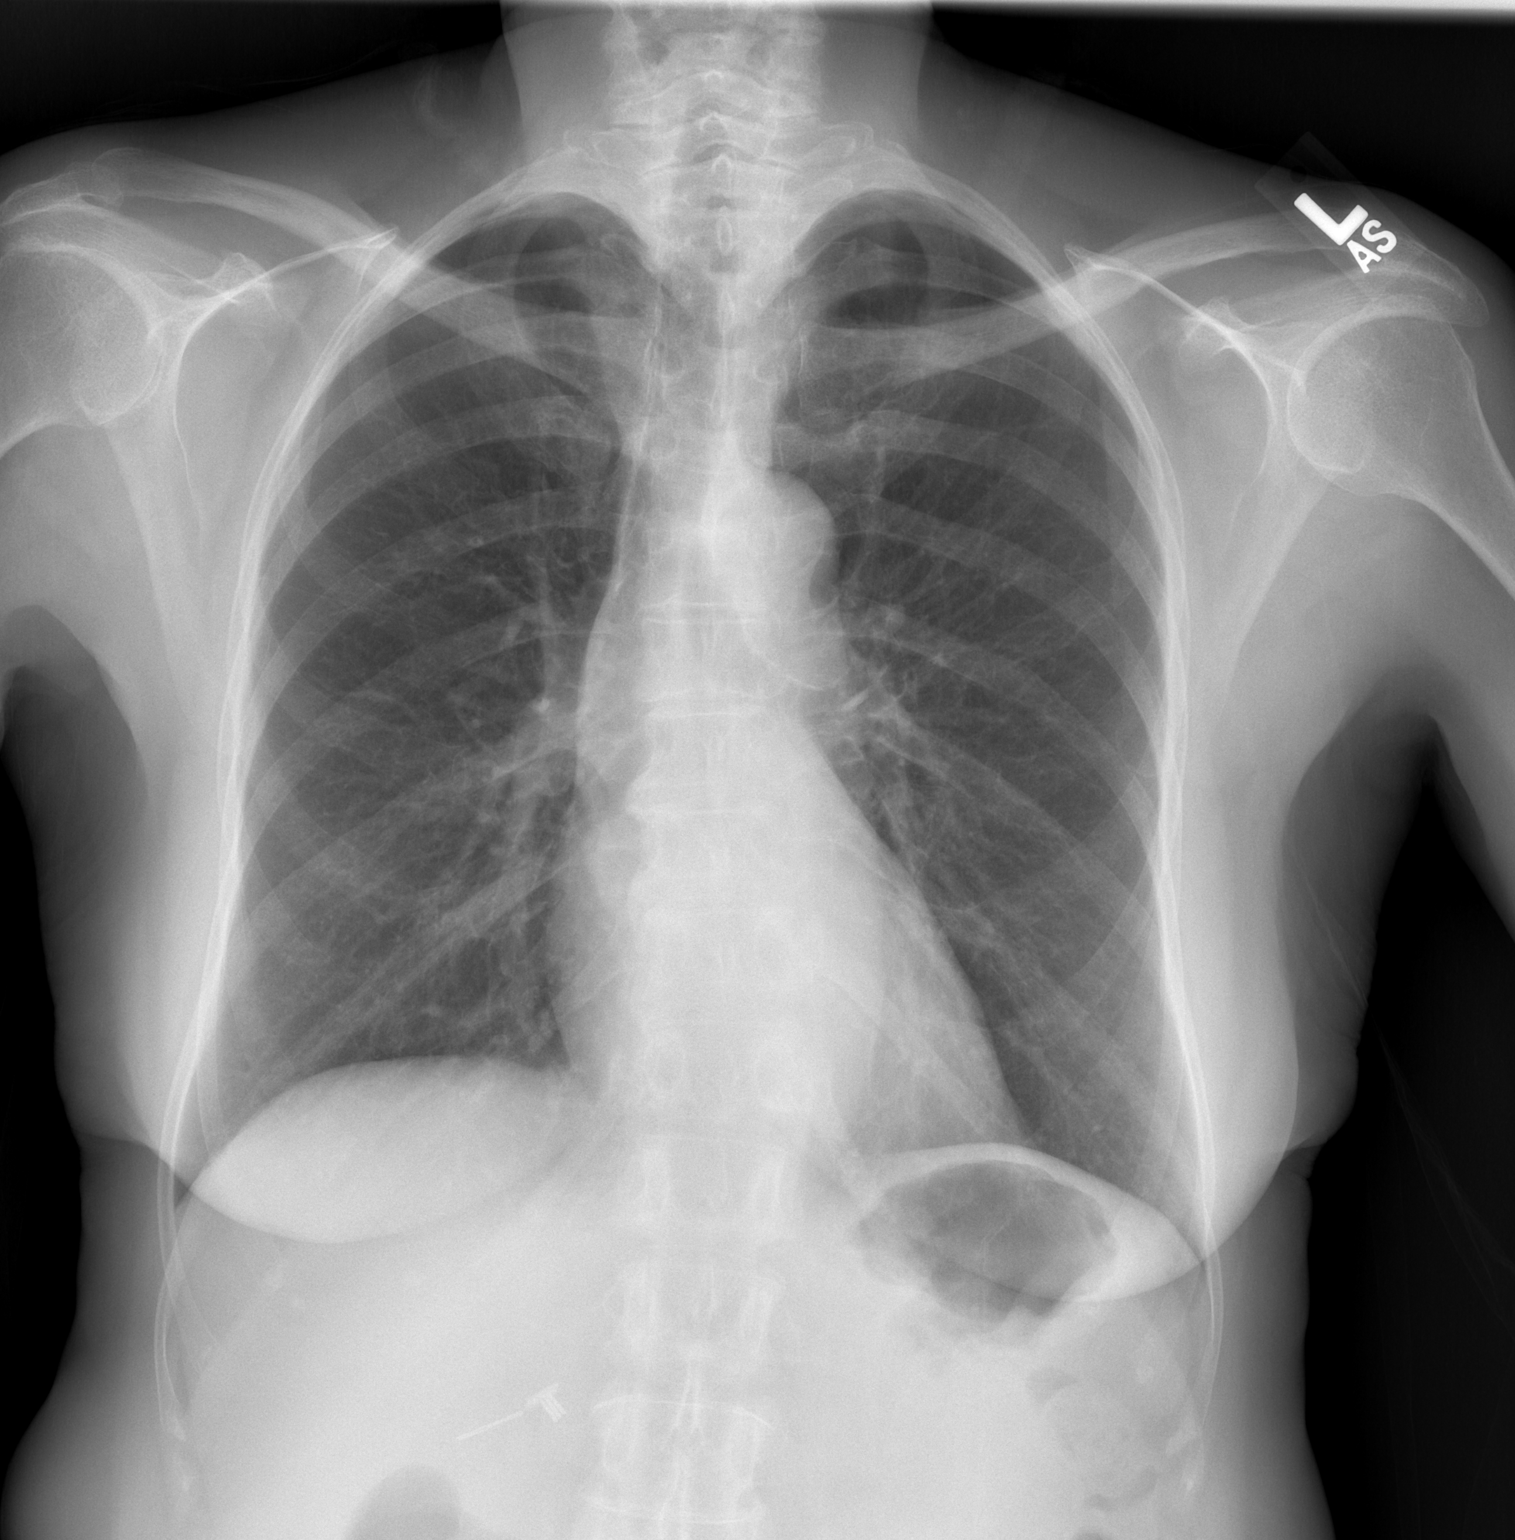

[w chest lat]
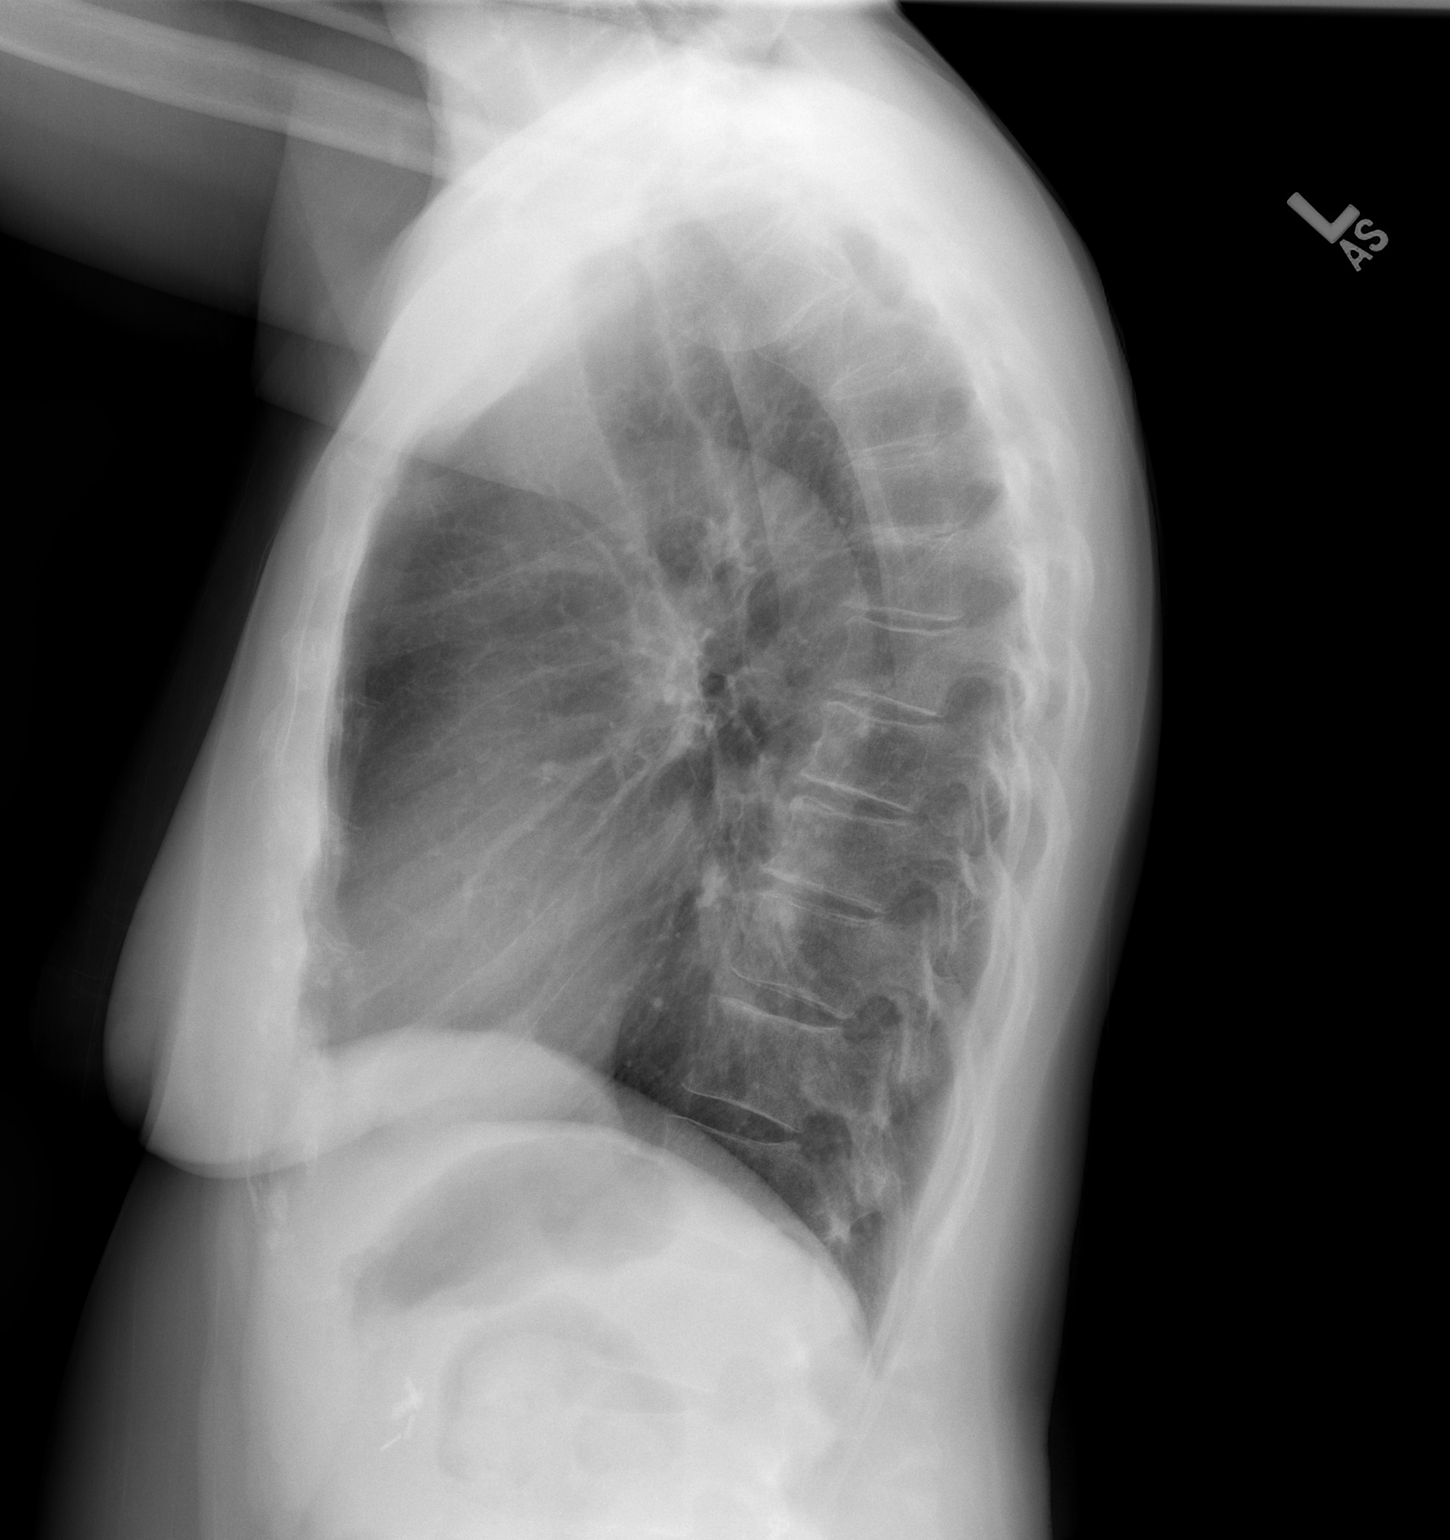

[2 of 2 positions shown; findings below may reference images not displayed]

FINDINGS: Mediastinum and hilar structures are normal. Lungs are clear. No
pleural effusion or pneumothorax. Heart size normal. Thoracic spine
scoliosis and degenerative change. Surgical clips right upper
quadrant.
IMPRESSION: No acute cardiopulmonary disease.

## 2018-09-21 DIAGNOSIS — Z79899 Other long term (current) drug therapy: Secondary | ICD-10-CM | POA: Diagnosis not present

## 2018-09-21 DIAGNOSIS — Z13820 Encounter for screening for osteoporosis: Secondary | ICD-10-CM | POA: Diagnosis not present

## 2018-09-21 DIAGNOSIS — M79642 Pain in left hand: Secondary | ICD-10-CM | POA: Diagnosis not present

## 2018-09-21 DIAGNOSIS — M1990 Unspecified osteoarthritis, unspecified site: Secondary | ICD-10-CM | POA: Diagnosis not present

## 2018-09-21 DIAGNOSIS — M0589 Other rheumatoid arthritis with rheumatoid factor of multiple sites: Secondary | ICD-10-CM | POA: Diagnosis not present

## 2018-09-21 DIAGNOSIS — M25562 Pain in left knee: Secondary | ICD-10-CM | POA: Diagnosis not present

## 2018-09-21 DIAGNOSIS — M359 Systemic involvement of connective tissue, unspecified: Secondary | ICD-10-CM | POA: Diagnosis not present

## 2018-12-27 ENCOUNTER — Other Ambulatory Visit: Payer: Self-pay | Admitting: Internal Medicine

## 2018-12-27 ENCOUNTER — Ambulatory Visit
Admission: RE | Admit: 2018-12-27 | Discharge: 2018-12-27 | Disposition: A | Discharge status: Home / Self Care | Payer: Medicare HMO | Source: Ambulatory Visit | Attending: Internal Medicine | Admitting: Internal Medicine

## 2018-12-27 DIAGNOSIS — R05 Cough: Secondary | ICD-10-CM

## 2018-12-27 DIAGNOSIS — R059 Cough, unspecified: Secondary | ICD-10-CM

## 2018-12-27 DIAGNOSIS — R5383 Other fatigue: Secondary | ICD-10-CM | POA: Diagnosis not present

## 2019-01-21 DIAGNOSIS — M79642 Pain in left hand: Secondary | ICD-10-CM | POA: Diagnosis not present

## 2019-01-21 DIAGNOSIS — M359 Systemic involvement of connective tissue, unspecified: Secondary | ICD-10-CM | POA: Diagnosis not present

## 2019-01-21 DIAGNOSIS — M25562 Pain in left knee: Secondary | ICD-10-CM | POA: Diagnosis not present

## 2019-01-21 DIAGNOSIS — Z79899 Other long term (current) drug therapy: Secondary | ICD-10-CM | POA: Diagnosis not present

## 2019-01-21 DIAGNOSIS — M1990 Unspecified osteoarthritis, unspecified site: Secondary | ICD-10-CM | POA: Diagnosis not present

## 2019-01-21 DIAGNOSIS — M0589 Other rheumatoid arthritis with rheumatoid factor of multiple sites: Secondary | ICD-10-CM | POA: Diagnosis not present

## 2019-01-21 DIAGNOSIS — Z23 Encounter for immunization: Secondary | ICD-10-CM | POA: Diagnosis not present

## 2019-01-21 DIAGNOSIS — Z13820 Encounter for screening for osteoporosis: Secondary | ICD-10-CM | POA: Diagnosis not present

## 2019-03-28 DIAGNOSIS — Z01419 Encounter for gynecological examination (general) (routine) without abnormal findings: Secondary | ICD-10-CM | POA: Diagnosis not present

## 2019-03-28 DIAGNOSIS — M8588 Other specified disorders of bone density and structure, other site: Secondary | ICD-10-CM | POA: Diagnosis not present

## 2019-03-28 DIAGNOSIS — N958 Other specified menopausal and perimenopausal disorders: Secondary | ICD-10-CM | POA: Diagnosis not present

## 2019-03-28 DIAGNOSIS — M069 Rheumatoid arthritis, unspecified: Secondary | ICD-10-CM | POA: Insufficient documentation

## 2019-03-28 DIAGNOSIS — Z6822 Body mass index (BMI) 22.0-22.9, adult: Secondary | ICD-10-CM | POA: Diagnosis not present

## 2019-03-28 DIAGNOSIS — Z1231 Encounter for screening mammogram for malignant neoplasm of breast: Secondary | ICD-10-CM | POA: Diagnosis not present

## 2019-06-13 DIAGNOSIS — Z23 Encounter for immunization: Secondary | ICD-10-CM | POA: Diagnosis not present

## 2019-06-13 DIAGNOSIS — K219 Gastro-esophageal reflux disease without esophagitis: Secondary | ICD-10-CM | POA: Diagnosis not present

## 2019-06-13 DIAGNOSIS — J309 Allergic rhinitis, unspecified: Secondary | ICD-10-CM | POA: Diagnosis not present

## 2019-06-13 DIAGNOSIS — M069 Rheumatoid arthritis, unspecified: Secondary | ICD-10-CM | POA: Diagnosis not present

## 2019-06-13 DIAGNOSIS — Z0000 Encounter for general adult medical examination without abnormal findings: Secondary | ICD-10-CM | POA: Diagnosis not present

## 2019-06-13 DIAGNOSIS — Z1389 Encounter for screening for other disorder: Secondary | ICD-10-CM | POA: Diagnosis not present

## 2019-07-04 DIAGNOSIS — Z13820 Encounter for screening for osteoporosis: Secondary | ICD-10-CM | POA: Diagnosis not present

## 2019-07-04 DIAGNOSIS — Z79899 Other long term (current) drug therapy: Secondary | ICD-10-CM | POA: Diagnosis not present

## 2019-07-04 DIAGNOSIS — M79642 Pain in left hand: Secondary | ICD-10-CM | POA: Diagnosis not present

## 2019-07-04 DIAGNOSIS — M25562 Pain in left knee: Secondary | ICD-10-CM | POA: Diagnosis not present

## 2019-07-04 DIAGNOSIS — Z23 Encounter for immunization: Secondary | ICD-10-CM | POA: Diagnosis not present

## 2019-07-04 DIAGNOSIS — M0589 Other rheumatoid arthritis with rheumatoid factor of multiple sites: Secondary | ICD-10-CM | POA: Diagnosis not present

## 2019-07-04 DIAGNOSIS — M1990 Unspecified osteoarthritis, unspecified site: Secondary | ICD-10-CM | POA: Diagnosis not present

## 2019-07-04 DIAGNOSIS — M359 Systemic involvement of connective tissue, unspecified: Secondary | ICD-10-CM | POA: Diagnosis not present

## 2019-08-08 DIAGNOSIS — Z79899 Other long term (current) drug therapy: Secondary | ICD-10-CM | POA: Diagnosis not present

## 2019-08-08 DIAGNOSIS — H5203 Hypermetropia, bilateral: Secondary | ICD-10-CM | POA: Diagnosis not present

## 2019-08-08 DIAGNOSIS — H524 Presbyopia: Secondary | ICD-10-CM | POA: Diagnosis not present

## 2019-08-08 DIAGNOSIS — H2513 Age-related nuclear cataract, bilateral: Secondary | ICD-10-CM | POA: Diagnosis not present

## 2019-08-08 DIAGNOSIS — M069 Rheumatoid arthritis, unspecified: Secondary | ICD-10-CM | POA: Diagnosis not present

## 2019-08-20 IMAGING — CR CHEST - 2 VIEW
2 series · 2 of 2 positions shown · non-contrast
Comparison: 12/14/2017

CLINICAL DATA: Cough and fever

EXAM:
CHEST - 2 VIEW

[w chest pa]
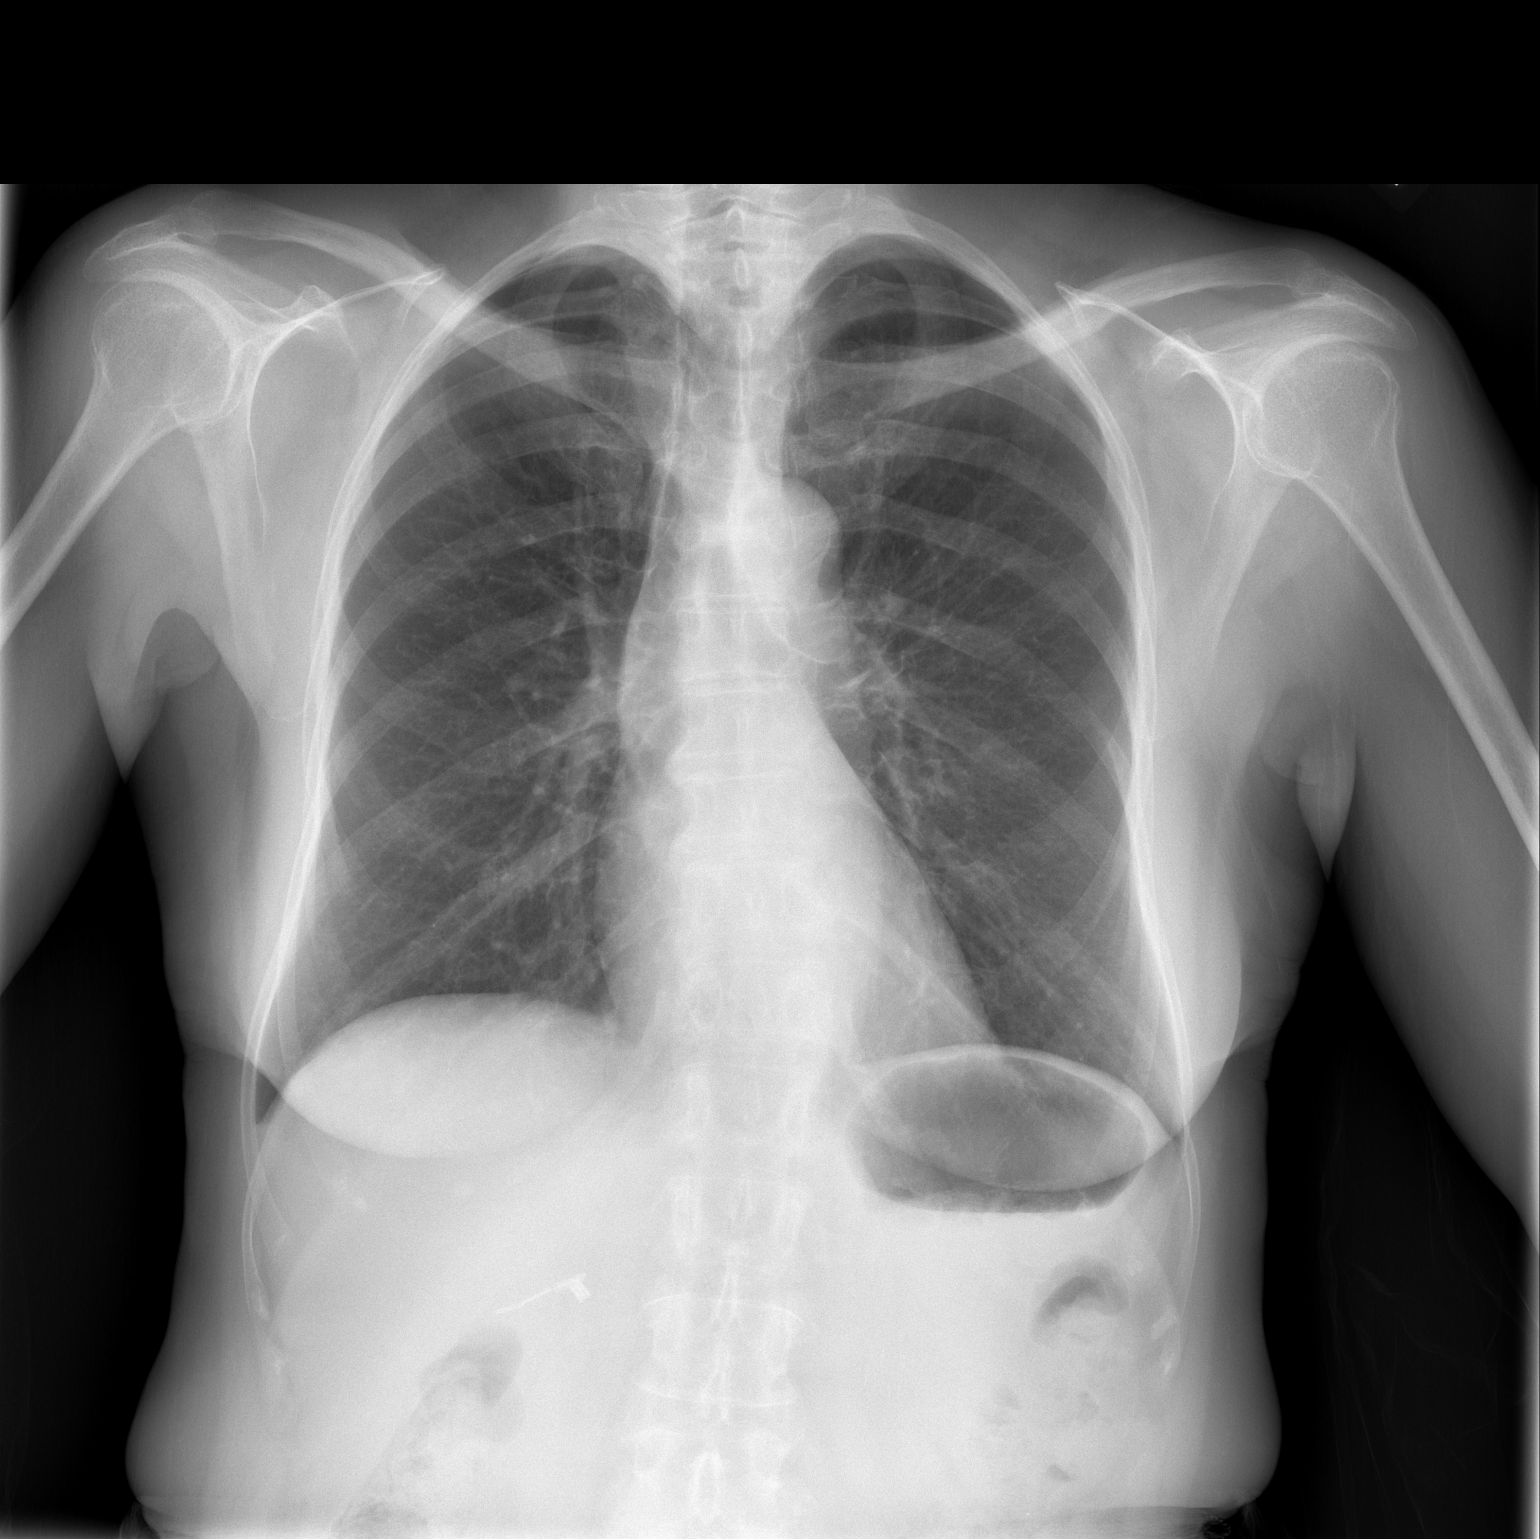

[w chest lat]
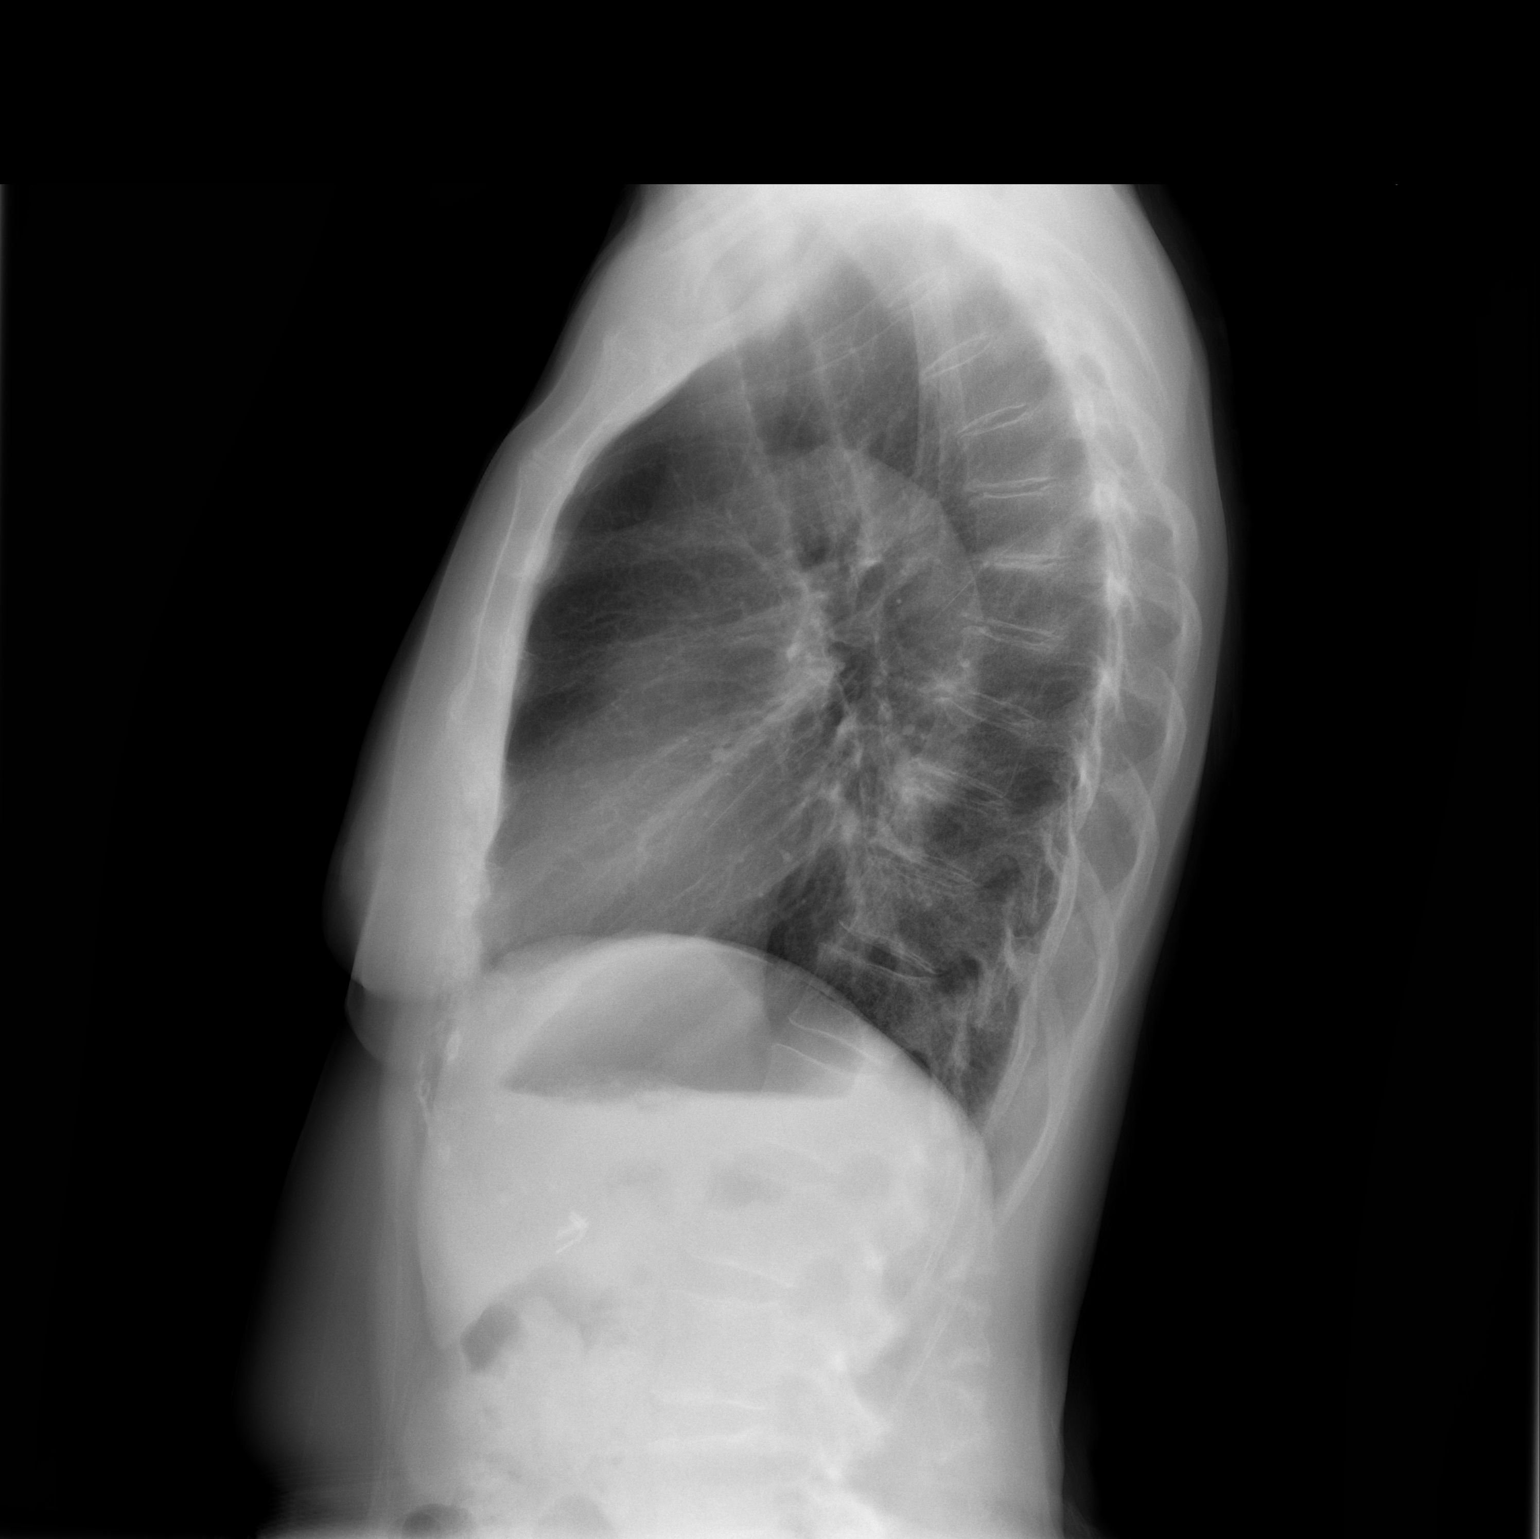

[2 of 2 positions shown; findings below may reference images not displayed]

FINDINGS: Heart size is normal. Mild aortic atherosclerotic calcification. The
vascularity is normal. The lungs are clear. No infiltrate, collapse
or effusion. No convincing bronchial thickening. Mild curvature in
degenerative change of the spine.
IMPRESSION: No active cardiopulmonary disease.

## 2019-09-20 ENCOUNTER — Other Ambulatory Visit: Payer: Self-pay

## 2019-09-20 DIAGNOSIS — Z20822 Contact with and (suspected) exposure to covid-19: Secondary | ICD-10-CM

## 2019-09-21 LAB — NOVEL CORONAVIRUS, NAA: SARS-CoV-2, NAA: NOT DETECTED

## 2019-11-03 DIAGNOSIS — M0589 Other rheumatoid arthritis with rheumatoid factor of multiple sites: Secondary | ICD-10-CM | POA: Diagnosis not present

## 2019-11-03 DIAGNOSIS — M1990 Unspecified osteoarthritis, unspecified site: Secondary | ICD-10-CM | POA: Diagnosis not present

## 2019-11-03 DIAGNOSIS — Z79899 Other long term (current) drug therapy: Secondary | ICD-10-CM | POA: Diagnosis not present

## 2019-11-03 DIAGNOSIS — M0579 Rheumatoid arthritis with rheumatoid factor of multiple sites without organ or systems involvement: Secondary | ICD-10-CM | POA: Diagnosis not present

## 2019-11-03 DIAGNOSIS — Z13820 Encounter for screening for osteoporosis: Secondary | ICD-10-CM | POA: Diagnosis not present

## 2019-11-03 DIAGNOSIS — M359 Systemic involvement of connective tissue, unspecified: Secondary | ICD-10-CM | POA: Diagnosis not present

## 2019-11-10 ENCOUNTER — Other Ambulatory Visit: Payer: Medicare HMO

## 2019-11-12 ENCOUNTER — Ambulatory Visit (HOSPITAL_COMMUNITY)
Admission: EM | Admit: 2019-11-12 | Discharge: 2019-11-12 | Disposition: A | Discharge status: Home / Self Care | Payer: Medicare HMO | Attending: Internal Medicine | Admitting: Internal Medicine

## 2019-11-12 ENCOUNTER — Other Ambulatory Visit: Payer: Self-pay

## 2019-11-12 ENCOUNTER — Encounter (HOSPITAL_COMMUNITY): Payer: Self-pay

## 2019-11-12 DIAGNOSIS — Z20822 Contact with and (suspected) exposure to covid-19: Secondary | ICD-10-CM | POA: Diagnosis not present

## 2019-11-12 NOTE — ED Triage Notes (Signed)
Pt presents for covid testing after an exposure with a friend; pt states she took a friend to the doctor on Monday and she tested positive this week, pt states she took precautions but want to get tested.  Pt not having any symptoms.

## 2019-11-12 NOTE — ED Provider Notes (Signed)
[facility] [facility]    CSN: [id number] Arrival date & time: 11/12/19  1150      History   Chief Complaint Chief Complaint  Patient presents with  . Covid Exposure    HPI Heather Roberson is a 69 y.o. female history of arthritis presenting today for evaluation of Covid testing after exposure.  Patient states that on Monday, approximately 6 days ago she took a friend to the doctor who ended up testing positive for Covid.  They were in close proximity in the car but had masks on.  Since she has not developed any symptoms.  Denies any URI symptoms.  Denies GI symptoms.  Denies fevers chills or body aches.  Denies loss of taste, smell or appetite.  HPI  Past Medical History:  Diagnosis Date  . Arthritis     There are no problems to display for this patient.   History reviewed. No pertinent surgical history.  OB History   No obstetric history on file.      Home Medications    Prior to Admission medications   Medication Sig Start Date End Date Taking? Authorizing Provider  Ascorbic Acid (VITAMIN C PO) Take 1 tablet by mouth daily.    [provider]  CALCIUM PO Take 1 tablet by mouth daily.    [provider]  Carboxymethylcellul-Glycerin (CLEAR EYES FOR DRY EYES OP) Place 1-2 drops into both eyes 2 (two) times daily as needed (for dryness).    [provider]  cholecalciferol (VITAMIN D) 1000 units tablet Take 1,000 Units by mouth daily.    [provider]  Cyanocobalamin (B-12 PO) Take 1 tablet by mouth daily.    [provider]  ferrous sulfate 325 (65 FE) MG tablet Take 325 mg by mouth daily with breakfast.    [provider]  fluticasone (FLONASE) 50 MCG/ACT nasal spray Place 1-2 sprays into both nostrils daily as needed for allergies or rhinitis.    [provider]  HUMIRA PEN 40 MG/0.8ML PNKT One injection every other week 02/29/16   [provider]  hydroxychloroquine (PLAQUENIL) 200 MG  tablet Take 2 tablets by mouth every morning. 02/05/16   [provider]  Multiple Vitamins-Minerals (ONE-A-DAY WOMENS 50 PLUS PO) Take 1 tablet by mouth daily after breakfast.    [provider]    Family History Family History  Family history unknown: Yes    Social History Social History   Tobacco Use  . Smoking status: Never Smoker  Substance Use Topics  . Alcohol use: No  . Drug use: No     Allergies   Other   Review of Systems Review of Systems  Constitutional: Negative for activity change, appetite change, chills, fatigue and fever.  HENT: Negative for congestion, ear pain, rhinorrhea, sinus pressure, sore throat and trouble swallowing.   Eyes: Negative for discharge and redness.  Respiratory: Negative for cough, chest tightness and shortness of breath.   Cardiovascular: Negative for chest pain.  Gastrointestinal: Negative for abdominal pain, diarrhea, nausea and vomiting.  Musculoskeletal: Negative for myalgias.  Skin: Negative for rash.  Neurological: Negative for dizziness, light-headedness and headaches.     Physical Exam Triage Vital Signs ED Triage Vitals  Enc Vitals Group     BP 11/12/19 1237 (!) 152/89     Pulse Rate 11/12/19 1237 100     Resp 11/12/19 1237 18     Temp 11/12/19 1237 98.5 F (36.9 C)     Temp Source 11/12/19 1237  Oral     SpO2 11/12/19 1237 100 %     Weight --      Height --      Head Circumference --      Peak Flow --      Pain Score 11/12/19 1227 0     Pain Loc --      Pain Edu? --      Excl. in [place]? --    No data found.  Updated Vital Signs BP (!) 152/89 (BP Location: Left Arm)   Pulse 100   Temp 98.5 F (36.9 C) (Oral)   Resp 18   SpO2 100%   Visual Acuity Right Eye Distance:   Left Eye Distance:   Bilateral Distance:    Right Eye Near:   Left Eye Near:    Bilateral Near:     Physical Exam Vitals and nursing note reviewed.  Constitutional:      Appearance: She is well-developed.      Comments: No acute distress  HENT:     Head: Normocephalic and atraumatic.     Nose: Nose normal.  Eyes:     Conjunctiva/sclera: Conjunctivae normal.     Comments: Wearing glasses  Cardiovascular:     Rate and Rhythm: Normal rate.  Pulmonary:     Effort: Pulmonary effort is normal. No respiratory distress.     Comments: Breathing comfortably at rest, CTABL, no wheezing, rales or other adventitious sounds auscultated Abdominal:     General: There is no distension.  Musculoskeletal:        General: Normal range of motion.     Cervical back: Neck supple.  Skin:    General: Skin is warm and dry.  Neurological:     Mental Status: She is alert and oriented to person, place, and time.      UC Treatments / Results  Labs (all labs ordered are listed, but only abnormal results are displayed) Labs Reviewed  NOVEL CORONAVIRUS, NAA (HOSP ORDER, SEND-OUT TO REF LAB; TAT 18-24 HRS)    EKG   Radiology No results found.  Procedures Procedures (including critical care time)  Medications Ordered in UC Medications - No data to display  Initial Impression / Assessment and Plan / UC Course  I have reviewed the triage vital signs and the nursing notes.  Pertinent labs & imaging results that were available during my care of the patient were reviewed by me and considered in my medical decision making (see chart for details).     Covid PCR pending, currently asymptomatic.  Recommended quarantining.  Continue to monitor for development of any symptoms in the next 2 weeks.  Rest and drink fluids.  Discussed strict return precautions. Patient verbalized understanding and is agreeable with plan.  Final Clinical Impressions(s) / UC Diagnoses   Final diagnoses:  Exposure to COVID-19 virus  Encounter for laboratory testing for COVID-19 virus     Discharge Instructions     Quarantine until results return Follow up if developing any symptoms   ED Prescriptions    None     PDMP  not reviewed this encounter.   Janith Lima, [place] 11/12/19 1247

## 2019-11-12 NOTE — Discharge Instructions (Signed)
Quarantine until results return Follow up if developing any symptoms

## 2019-11-13 LAB — NOVEL CORONAVIRUS, NAA (HOSP ORDER, SEND-OUT TO REF LAB; TAT 18-24 HRS): SARS-CoV-2, NAA: NOT DETECTED

## 2019-11-15 ENCOUNTER — Other Ambulatory Visit: Payer: Medicare HMO

## 2019-12-03 ENCOUNTER — Encounter (HOSPITAL_COMMUNITY): Payer: Self-pay | Admitting: Emergency Medicine

## 2019-12-03 ENCOUNTER — Ambulatory Visit (HOSPITAL_COMMUNITY)
Admission: EM | Admit: 2019-12-03 | Discharge: 2019-12-03 | Disposition: A | Discharge status: Home / Self Care | Payer: Medicare HMO | Attending: Family Medicine | Admitting: Family Medicine

## 2019-12-03 ENCOUNTER — Other Ambulatory Visit: Payer: Self-pay

## 2019-12-03 DIAGNOSIS — R030 Elevated blood-pressure reading, without diagnosis of hypertension: Secondary | ICD-10-CM

## 2019-12-03 DIAGNOSIS — R0981 Nasal congestion: Secondary | ICD-10-CM

## 2019-12-03 DIAGNOSIS — H938X3 Other specified disorders of ear, bilateral: Secondary | ICD-10-CM

## 2019-12-03 LAB — CBG MONITORING, ED: Glucose-Capillary: 71 mg/dL (ref 70–99)

## 2019-12-03 LAB — GLUCOSE, CAPILLARY: Glucose-Capillary: 71 mg/dL (ref 70–99)

## 2019-12-03 MED ORDER — CARBAMIDE PEROXIDE 6.5 % OT SOLN: 5.0000 [drp] | Freq: Two times a day (BID) | OTIC | 0 refills | Status: AC

## 2019-12-03 NOTE — ED Provider Notes (Addendum)
[name]    CSN: [id number] Arrival date & time: 12/03/19  1221      History   Chief Complaint Chief Complaint  Patient presents with  . Hypertension    HPI Heather Roberson is a 69 y.o. female.   HPI  Patient presents for evaluation of her blood pressure.  Patient reports having intermittent episodes of "funny feeling occurring bilateral chest and bilateral arms" occurring twice per week for several weeks. She checked her blood pressure at home with wrist and the reading was high. She is unable to verbalize and/or quantify specifically the type of feeling she is experiencing.  However she denies any slurring of speech, facial weakness, shortness of breath, chest pain, or generalized body weakness.  She has not experienced a fall.  She endorses some urinary frequency although denies dysuria.  He is followed closely by her rheumatologist as she has RA.  She does complain of some chronic nasal congestion and congestion of both the ears.  She reports previous elevated blood pressure readings at previous medical appointments however her primary care advised her to watch her blood pressure and recorded at home.  She denies any upcoming follow-up with her primary care.  Past Medical History:  Diagnosis Date  . Arthritis     There are no problems to display for this patient.   History reviewed. No pertinent surgical history.  OB History   No obstetric history on file.      Home Medications    Prior to Admission medications   Medication Sig Start Date End Date Taking? Authorizing Provider  Ascorbic Acid (VITAMIN C PO) Take 1 tablet by mouth daily.    [provider]  CALCIUM PO Take 1 tablet by mouth daily.    [provider]  Carboxymethylcellul-Glycerin (CLEAR EYES FOR DRY EYES OP) Place 1-2 drops into both eyes 2 (two) times daily as needed (for dryness).    [provider]  cholecalciferol (VITAMIN D) 1000 units tablet Take 1,000  Units by mouth daily.    [provider]  Cyanocobalamin (B-12 PO) Take 1 tablet by mouth daily.    [provider]  ferrous sulfate 325 (65 FE) MG tablet Take 325 mg by mouth daily with breakfast.    [provider]  fluticasone (FLONASE) 50 MCG/ACT nasal spray Place 1-2 sprays into both nostrils daily as needed for allergies or rhinitis.    [provider]  HUMIRA PEN 40 MG/0.8ML PNKT One injection every other week 02/29/16   [provider]  hydroxychloroquine (PLAQUENIL) 200 MG tablet Take 2 tablets by mouth every morning. 02/05/16   [provider]  Multiple Vitamins-Minerals (ONE-A-DAY WOMENS 50 PLUS PO) Take 1 tablet by mouth daily after breakfast.    [provider]    Family History Family History  Family history unknown: Yes    Social History Social History   Tobacco Use  . Smoking status: Never Smoker  Substance Use Topics  . Alcohol use: No  . Drug use: No     Allergies   Other   Review of Systems Review of Systems Pertinent negatives listed in HPI   Physical Exam Triage Vital Signs ED Triage Vitals [12/03/19 1239]  Enc Vitals Group     BP (!) 153/90     Pulse Rate 96     Resp 18     Temp 97.8 F (36.6 C)     Temp Source Oral     SpO2 100 %  Weight      Height      Head Circumference      Peak Flow      Pain Score      Pain Loc      Pain Edu?      Excl. in [place]?    No data found.  Updated Vital Signs BP (!) 153/90 (BP Location: Left Arm) Comment (BP Location): small adult cuff  Pulse 96   Temp 97.8 F (36.6 C) (Oral)   Resp 18   SpO2 100%   Visual Acuity Right Eye Distance:   Left Eye Distance:   Bilateral Distance:    Right Eye Near:   Left Eye Near:    Bilateral Near:     Physical Exam Constitutional:      Appearance: Normal appearance.     Comments: Chronically ill appearing   HENT:     Head: Normocephalic.     Nose: Congestion present.  Eyes:     Extraocular  Movements: Extraocular movements intact.     Conjunctiva/sclera: Conjunctivae normal.     Pupils: Pupils are equal, round, and reactive to light.  Neck:     Vascular: No carotid bruit.  Cardiovascular:     Rate and Rhythm: Normal rate and regular rhythm.  Pulmonary:     Effort: Pulmonary effort is normal.     Breath sounds: Normal breath sounds.  Abdominal:     General: Abdomen is flat.     Palpations: Abdomen is soft.  Musculoskeletal:        General: Normal range of motion.     Cervical back: Normal range of motion and neck supple. No rigidity.  Skin:    General: Skin is warm.     Capillary Refill: Capillary refill takes less than 2 seconds.  Neurological:     General: No focal deficit present.     Mental Status: She is alert and oriented to person, place, and time.     Motor: No weakness.     Gait: Gait normal.  Psychiatric:        Mood and Affect: Mood normal.        Behavior: Behavior normal.        Thought Content: Thought content normal.        Judgment: Judgment normal.      UC Treatments / Results  Labs (all labs ordered are listed, but only abnormal results are displayed) Labs Reviewed - No data to display  EKG   Radiology No results found.  Procedures Procedures (including critical care time)  Medications Ordered in UC Medications - No data to display  Initial Impression / Assessment and Plan / UC Course  I have reviewed the triage vital signs and the nursing notes.  Pertinent labs & imaging results that were available during my care of the patient were reviewed by me and considered in my medical decision making (see chart for details).    Elevated BP without diagnosis of hypertension -Follow-up with PCP. BP elevated today, however, neurological exam unremarkable. ECG consistent with prior tracings. Will defer to PCP for additional evaluation.   Nasal congestion -Previously prescribed Flonase, not currently using, recommended restarting to  improve symptoms of nasal congestion.   Ear congestion, bilateral - Cerumen present in both ears, Debrox drops prescribed to facilitate cerumen removal.  Present today with a nonspecific symptom of "feeling funny". She had difficulty articulating the reason for her visit today. Therefore completed exam based on limited information I was able  to retreive from patient regarding her reason for presenting today. Checked a glucose, normal. ECG consistent with prior tracings. Red flags discussed with patient that would warrant evaluation at the ER. Recommended contacting PCP on Monday. Final Clinical Impressions(s) / UC Diagnoses   Final diagnoses:  Elevated BP without diagnosis of hypertension  Nasal congestion  Ear congestion, bilateral     Discharge Instructions     Blood pressure today 153/90. Debrox drops prescribed to reduce ear wax Resume home flonase for nasal congestion. Schedule appointment with primary care doctor to discuss your blood pressure.    ED Prescriptions    Medication Sig Dispense Auth. Provider   carbamide peroxide (DEBROX) 6.5 % OTIC solution Place 5 drops into both ears 2 (two) times daily. 15 mL Scot Jun, FNP     PDMP not reviewed this encounter.   Scot Jun, FNP 12/04/19 [place], FNP 12/04/19 1012    Scot Jun,  12/04/19 1020

## 2019-12-03 NOTE — Discharge Instructions (Addendum)
Blood pressure today 153/90. Debrox drops prescribed to reduce ear wax Resume home flonase for nasal congestion. Schedule appointment with primary care doctor to discuss your blood pressure.

## 2019-12-03 NOTE — ED Triage Notes (Signed)
Patient wanting blood pressure checked.   Patient speaks of "weird feeling that came over me".  Denies pain, denies sob.  Patient having difficulty describing "feeling"

## 2019-12-06 LAB — POCT URINALYSIS DIP (DEVICE)
Bilirubin Urine: NEGATIVE
Glucose, UA: NEGATIVE mg/dL
Ketones, ur: NEGATIVE mg/dL
Leukocytes,Ua: NEGATIVE
Nitrite: NEGATIVE
Protein, ur: NEGATIVE mg/dL
Specific Gravity, Urine: 1.01 (ref 1.005–1.030)
Urobilinogen, UA: 0.2 mg/dL (ref 0.0–1.0)
pH: 6 (ref 5.0–8.0)

## 2019-12-07 LAB — POCT URINALYSIS DIP (DEVICE)
Bilirubin Urine: NEGATIVE
Glucose, UA: NEGATIVE mg/dL
Ketones, ur: NEGATIVE mg/dL
Leukocytes,Ua: NEGATIVE
Nitrite: NEGATIVE
Protein, ur: NEGATIVE mg/dL
Specific Gravity, Urine: 1.01 (ref 1.005–1.030)
Urobilinogen, UA: 0.2 mg/dL (ref 0.0–1.0)
pH: 6 (ref 5.0–8.0)

## 2020-02-13 DIAGNOSIS — W19XXXA Unspecified fall, initial encounter: Secondary | ICD-10-CM | POA: Diagnosis not present

## 2020-02-13 DIAGNOSIS — R220 Localized swelling, mass and lump, head: Secondary | ICD-10-CM | POA: Diagnosis not present

## 2020-02-28 DIAGNOSIS — M0589 Other rheumatoid arthritis with rheumatoid factor of multiple sites: Secondary | ICD-10-CM | POA: Diagnosis not present

## 2020-02-28 DIAGNOSIS — Z79899 Other long term (current) drug therapy: Secondary | ICD-10-CM | POA: Diagnosis not present

## 2020-02-28 DIAGNOSIS — M1990 Unspecified osteoarthritis, unspecified site: Secondary | ICD-10-CM | POA: Diagnosis not present

## 2020-02-28 DIAGNOSIS — M0579 Rheumatoid arthritis with rheumatoid factor of multiple sites without organ or systems involvement: Secondary | ICD-10-CM | POA: Diagnosis not present

## 2020-02-28 DIAGNOSIS — M359 Systemic involvement of connective tissue, unspecified: Secondary | ICD-10-CM | POA: Diagnosis not present

## 2020-03-29 DIAGNOSIS — G8929 Other chronic pain: Secondary | ICD-10-CM | POA: Diagnosis not present

## 2020-03-29 DIAGNOSIS — R1032 Left lower quadrant pain: Secondary | ICD-10-CM | POA: Diagnosis not present

## 2020-03-29 DIAGNOSIS — M25562 Pain in left knee: Secondary | ICD-10-CM | POA: Diagnosis not present

## 2020-04-17 DIAGNOSIS — Z6821 Body mass index (BMI) 21.0-21.9, adult: Secondary | ICD-10-CM | POA: Diagnosis not present

## 2020-04-17 DIAGNOSIS — Z1231 Encounter for screening mammogram for malignant neoplasm of breast: Secondary | ICD-10-CM | POA: Diagnosis not present

## 2020-04-17 DIAGNOSIS — Z01419 Encounter for gynecological examination (general) (routine) without abnormal findings: Secondary | ICD-10-CM | POA: Diagnosis not present

## 2020-05-02 DIAGNOSIS — J302 Other seasonal allergic rhinitis: Secondary | ICD-10-CM | POA: Diagnosis not present

## 2020-05-02 DIAGNOSIS — W57XXXA Bitten or stung by nonvenomous insect and other nonvenomous arthropods, initial encounter: Secondary | ICD-10-CM | POA: Diagnosis not present

## 2020-05-02 DIAGNOSIS — L819 Disorder of pigmentation, unspecified: Secondary | ICD-10-CM | POA: Diagnosis not present

## 2020-05-02 DIAGNOSIS — L299 Pruritus, unspecified: Secondary | ICD-10-CM | POA: Diagnosis not present

## 2020-06-21 DIAGNOSIS — I700 Atherosclerosis of aorta: Secondary | ICD-10-CM | POA: Diagnosis not present

## 2020-06-21 DIAGNOSIS — Z0000 Encounter for general adult medical examination without abnormal findings: Secondary | ICD-10-CM | POA: Diagnosis not present

## 2020-06-21 DIAGNOSIS — J309 Allergic rhinitis, unspecified: Secondary | ICD-10-CM | POA: Diagnosis not present

## 2020-06-21 DIAGNOSIS — M069 Rheumatoid arthritis, unspecified: Secondary | ICD-10-CM | POA: Diagnosis not present

## 2020-06-21 DIAGNOSIS — M8580 Other specified disorders of bone density and structure, unspecified site: Secondary | ICD-10-CM | POA: Diagnosis not present

## 2020-06-21 DIAGNOSIS — Z1389 Encounter for screening for other disorder: Secondary | ICD-10-CM | POA: Diagnosis not present

## 2020-06-21 DIAGNOSIS — Z23 Encounter for immunization: Secondary | ICD-10-CM | POA: Diagnosis not present

## 2020-07-02 DIAGNOSIS — M79642 Pain in left hand: Secondary | ICD-10-CM | POA: Diagnosis not present

## 2020-07-02 DIAGNOSIS — M19042 Primary osteoarthritis, left hand: Secondary | ICD-10-CM | POA: Diagnosis not present

## 2020-07-02 DIAGNOSIS — Z79899 Other long term (current) drug therapy: Secondary | ICD-10-CM | POA: Diagnosis not present

## 2020-07-02 DIAGNOSIS — M19041 Primary osteoarthritis, right hand: Secondary | ICD-10-CM | POA: Diagnosis not present

## 2020-07-02 DIAGNOSIS — M79641 Pain in right hand: Secondary | ICD-10-CM | POA: Diagnosis not present

## 2020-07-02 DIAGNOSIS — M359 Systemic involvement of connective tissue, unspecified: Secondary | ICD-10-CM | POA: Diagnosis not present

## 2020-07-02 DIAGNOSIS — M0579 Rheumatoid arthritis with rheumatoid factor of multiple sites without organ or systems involvement: Secondary | ICD-10-CM | POA: Diagnosis not present

## 2020-07-02 DIAGNOSIS — M1990 Unspecified osteoarthritis, unspecified site: Secondary | ICD-10-CM | POA: Diagnosis not present

## 2020-07-30 DIAGNOSIS — J302 Other seasonal allergic rhinitis: Secondary | ICD-10-CM | POA: Diagnosis not present

## 2020-08-07 DIAGNOSIS — R030 Elevated blood-pressure reading, without diagnosis of hypertension: Secondary | ICD-10-CM | POA: Diagnosis not present

## 2020-08-07 DIAGNOSIS — M069 Rheumatoid arthritis, unspecified: Secondary | ICD-10-CM | POA: Diagnosis not present

## 2020-08-07 DIAGNOSIS — D8481 Immunodeficiency due to conditions classified elsewhere: Secondary | ICD-10-CM | POA: Diagnosis not present

## 2020-08-07 DIAGNOSIS — Z0000 Encounter for general adult medical examination without abnormal findings: Secondary | ICD-10-CM | POA: Diagnosis not present

## 2020-08-07 DIAGNOSIS — Z008 Encounter for other general examination: Secondary | ICD-10-CM | POA: Diagnosis not present

## 2020-08-07 DIAGNOSIS — J309 Allergic rhinitis, unspecified: Secondary | ICD-10-CM | POA: Diagnosis not present

## 2020-08-07 DIAGNOSIS — E559 Vitamin D deficiency, unspecified: Secondary | ICD-10-CM | POA: Diagnosis not present

## 2020-08-07 DIAGNOSIS — Z79899 Other long term (current) drug therapy: Secondary | ICD-10-CM | POA: Diagnosis not present

## 2022-02-21 ENCOUNTER — Ambulatory Visit: Payer: Medicare HMO | Admitting: Podiatry

## 2022-02-21 DIAGNOSIS — I10 Essential (primary) hypertension: Secondary | ICD-10-CM | POA: Insufficient documentation

## 2022-02-21 DIAGNOSIS — M1990 Unspecified osteoarthritis, unspecified site: Secondary | ICD-10-CM | POA: Insufficient documentation

## 2022-02-21 DIAGNOSIS — M79671 Pain in right foot: Secondary | ICD-10-CM

## 2022-02-21 DIAGNOSIS — Q828 Other specified congenital malformations of skin: Secondary | ICD-10-CM

## 2022-02-21 DIAGNOSIS — M2550 Pain in unspecified joint: Secondary | ICD-10-CM | POA: Insufficient documentation

## 2022-02-21 DIAGNOSIS — M8580 Other specified disorders of bone density and structure, unspecified site: Secondary | ICD-10-CM | POA: Insufficient documentation

## 2022-02-21 DIAGNOSIS — M359 Systemic involvement of connective tissue, unspecified: Secondary | ICD-10-CM | POA: Insufficient documentation

## 2022-03-03 ENCOUNTER — Encounter: Payer: Self-pay | Admitting: Podiatry

## 2022-03-03 NOTE — Progress Notes (Signed)
Subjective: ?[name] presents today referred by Heather Orn, MD for complaint of painful porokeratotic lesions right lower extremity. Pain prevent(s) comfortable ambulation. Aggravating factor is weightbearing with and without shoegear..  ? ?Past Medical History:  ?Diagnosis Date  ? Arthritis   ?  ? ?Patient Active Problem List  ? Diagnosis Date Noted  ? Connective tissue disease ([place]) 02/21/2022  ? Essential hypertension 02/21/2022  ? Multiple joint pain 02/21/2022  ? Osteoarthritis 02/21/2022  ? Osteopenia 02/21/2022  ? Rheumatoid arthritis ([place]) 03/28/2019  ? Pain in finger of right hand 12/14/2016  ? Paronychia of right thumb 12/14/2016  ?  ? ?No past surgical history on file.  ? ?Current Outpatient Medications on File Prior to Visit  ?Medication Sig Dispense Refill  ? Abatacept (ORENCIA) 125 MG/ML SOSY 1 ml    ? hydroxychloroquine (PLAQUENIL) 200 MG tablet Plaquenil 200 mg tablet ? Take 1 tablet every day by oral route.    ? leflunomide (ARAVA) 10 MG tablet Take 1 tablet by mouth daily.    ? amLODipine (NORVASC) 2.5 MG tablet amlodipine 2.5 mg tablet    ? Ascorbic Acid (VITAMIN C PO) Take 1 tablet by mouth daily.    ? ascorbic acid (VITAMIN C) 500 MG tablet Take by mouth.    ? benzonatate (TESSALON) 100 MG capsule 1 capsule as needed    ? brompheniramine-pseudoephedrine-DM 30-2-10 MG/5ML syrup Take by mouth.    ? Calcium Carb-Cholecalciferol (CALCIUM 600+D) 600-10 MG-MCG TABS 1 tablet with food    ? CALCIUM PO Take 1 tablet by mouth daily.    ? carbamide peroxide (DEBROX) 6.5 % OTIC solution Place 5 drops into both ears 2 (two) times daily. 15 mL 0  ? Carboxymethylcellul-Glycerin (CLEAR EYES FOR DRY EYES OP) Place 1-2 drops into both eyes 2 (two) times daily as needed (for dryness).    ? cholecalciferol (VITAMIN D) 1000 units tablet Take 1,000 Units by mouth daily.    ? Cyanocobalamin (B-12 PO) Take 1 tablet by mouth daily.    ? ferrous sulfate 325 (65 FE) MG tablet Take 325 mg by mouth daily with  breakfast.    ? fluticasone (FLONASE ALLERGY RELIEF) 50 MCG/ACT nasal spray 2 sprays in each nostril    ? fluticasone (FLONASE) 50 MCG/ACT nasal spray Place 1-2 sprays into both nostrils daily as needed for allergies or rhinitis.    ? HUMIRA PEN 40 MG/0.8ML PNKT One injection every other week  2  ? hydroxychloroquine (PLAQUENIL) 200 MG tablet Take 2 tablets by mouth every morning.  0  ? levocetirizine (XYZAL) 5 MG tablet Take by mouth.    ? Multiple Vitamins-Minerals (ONE-A-DAY WOMENS 50 PLUS PO) Take 1 tablet by mouth daily after breakfast.    ? ?No current facility-administered medications on file prior to visit.  ?  ? ?Allergies  ?Allergen Reactions  ? Other Cough  ?  "SOME FOODS" (cannot name specific foods, however)  ?  ? ?Social History  ? ?Occupational History  ? Not on file  ?Tobacco Use  ? Smoking status: Never  ? Smokeless tobacco: Not on file  ?Substance and Sexual Activity  ? Alcohol use: No  ? Drug use: No  ? Sexual activity: Not on file  ?  ? ?Family History  ?Family history unknown: Yes  ?  ? ? ?There is no immunization history on file for this patient.  ? ?Objective: ?Heather Roberson is a pleasant 71 y.o. female WD, WN in NAD. AAO x 3. ? ?There  were no vitals filed for this visit. ? ?Vascular Examination:  ?CFT <3 seconds b/l LE. Palpable DP pulse(s) right lower extremity Palpable PT pulse(s) right lower extremity Faintly palpable DP pulse(s) LLE. Faintly palpable PT pulse(s) left lower extremity. Pedal hair absent. No pain with calf compression b/l. No pain with calf compression RLE. No edema noted b/l LE. No cyanosis or clubbing noted b/l LE. ? ?Dermatological Examination: ?Pedal skin thin, shiny and atrophic b/l LE. No open wounds b/l LE. No interdigital macerations noted b/l LE. Toenails 1-5 b/l well maintained with adequate length. No erythema, no edema, no drainage, no fluctuance. Porokeratotic lesion(s) submet head 5 right foot. No erythema, no edema, no drainage, no  fluctuance. ? ?Musculoskeletal: ?Muscle strength 5/5 to all lower extremity muscle groups bilaterally. HAV with bunion deformity noted b/l LE. Hammertoe deformity noted 2-5 b/l. ? ?Neurological: ?Protective sensation intact 5/5 intact bilaterally with 10g monofilament b/l. Vibratory sensation intact b/l. ? ?Assessment: ?1. Porokeratosis   ?2. Pain in right foot   ?  ?Plan: ?-Patient was evaluated and treated. All patient's and/or POA's questions/concerns answered on today's visit. ?-Medicare ABN signed. Patient consents for services of paring of corn(s)/callus(es)/porokeratos(es) today. Copy in patient chart. ?-Porokeratotic lesion(s) submet head 5 right foot pared and enucleated with sterile scalpel blade without incident. Total number of lesions debrided=1. ?-Patient/POA to call should there be question/concern in the interim. ? ?Return in about 4 months (around 06/24/2022). ? ?Marzetta Board, DPM ?

## 2022-06-24 ENCOUNTER — Ambulatory Visit: Payer: Medicare HMO | Admitting: Podiatry

## 2022-09-04 ENCOUNTER — Ambulatory Visit
Admission: RE | Admit: 2022-09-04 | Discharge: 2022-09-04 | Disposition: A | Discharge status: Home / Self Care | Payer: Medicare HMO | Source: Ambulatory Visit | Attending: Registered Nurse | Admitting: Registered Nurse

## 2022-09-04 ENCOUNTER — Other Ambulatory Visit: Payer: Self-pay | Admitting: Registered Nurse

## 2022-09-04 DIAGNOSIS — R053 Chronic cough: Secondary | ICD-10-CM

## 2023-01-15 ENCOUNTER — Other Ambulatory Visit: Payer: Self-pay | Admitting: Obstetrics and Gynecology

## 2023-01-15 DIAGNOSIS — N6002 Solitary cyst of left breast: Secondary | ICD-10-CM

## 2023-01-28 ENCOUNTER — Ambulatory Visit
Admission: RE | Admit: 2023-01-28 | Discharge: 2023-01-28 | Disposition: A | Discharge status: Home / Self Care | Payer: Medicare PPO | Source: Ambulatory Visit | Attending: Obstetrics and Gynecology | Admitting: Obstetrics and Gynecology

## 2023-01-28 ENCOUNTER — Ambulatory Visit
Admission: RE | Admit: 2023-01-28 | Discharge: 2023-01-28 | Disposition: A | Discharge status: Home / Self Care | Payer: Medicare HMO | Source: Ambulatory Visit | Attending: Obstetrics and Gynecology | Admitting: Obstetrics and Gynecology

## 2023-01-28 ENCOUNTER — Other Ambulatory Visit: Payer: Self-pay | Admitting: Obstetrics and Gynecology

## 2023-01-28 DIAGNOSIS — N6002 Solitary cyst of left breast: Secondary | ICD-10-CM

## 2023-01-28 DIAGNOSIS — N6320 Unspecified lump in the left breast, unspecified quadrant: Secondary | ICD-10-CM

## 2023-02-09 ENCOUNTER — Ambulatory Visit
Admission: RE | Admit: 2023-02-09 | Discharge: 2023-02-09 | Disposition: A | Discharge status: Home / Self Care | Payer: Medicare PPO | Source: Ambulatory Visit | Attending: Obstetrics and Gynecology | Admitting: Obstetrics and Gynecology

## 2023-02-09 DIAGNOSIS — N6320 Unspecified lump in the left breast, unspecified quadrant: Secondary | ICD-10-CM

## 2023-02-09 HISTORY — PX: BREAST BIOPSY: SHX20

## 2023-02-12 ENCOUNTER — Telehealth: Payer: Self-pay | Admitting: Hematology and Oncology

## 2023-02-12 NOTE — Telephone Encounter (Signed)
Spoke to patient to confirm upcoming morning [facility] clinic appointment on 5/1, paperwork will be sent via mail.   Gave location and time, also informed patient that the surgeon's office would be calling as well to get information from them similar to the packet that they will be receiving so make sure to do both.  Reminded patient that all providers will be coming to the clinic to see them HERE and if they had any questions to not hesitate to reach back out to myself or their navigators. 

## 2023-02-13 ENCOUNTER — Ambulatory Visit: Payer: Self-pay | Admitting: Surgery

## 2023-02-16 ENCOUNTER — Encounter: Payer: Self-pay | Admitting: *Deleted

## 2023-02-16 ENCOUNTER — Ambulatory Visit: Payer: Self-pay | Admitting: Surgery

## 2023-02-16 DIAGNOSIS — Z171 Estrogen receptor negative status [ER-]: Secondary | ICD-10-CM | POA: Insufficient documentation

## 2023-02-18 ENCOUNTER — Other Ambulatory Visit: Payer: Self-pay

## 2023-02-18 ENCOUNTER — Encounter: Payer: Self-pay | Admitting: *Deleted

## 2023-02-18 ENCOUNTER — Ambulatory Visit: Payer: Self-pay | Admitting: Surgery

## 2023-02-18 ENCOUNTER — Inpatient Hospital Stay (HOSPITAL_BASED_OUTPATIENT_CLINIC_OR_DEPARTMENT_OTHER): Payer: Medicare PPO | Admitting: Genetic Counselor

## 2023-02-18 ENCOUNTER — Ambulatory Visit
Admission: RE | Admit: 2023-02-18 | Discharge: 2023-02-18 | Disposition: A | Discharge status: Home / Self Care | Payer: Medicare PPO | Source: Ambulatory Visit | Attending: Radiation Oncology | Admitting: Radiation Oncology

## 2023-02-18 ENCOUNTER — Encounter: Payer: Self-pay | Admitting: Physical Therapy

## 2023-02-18 ENCOUNTER — Ambulatory Visit: Payer: Medicare PPO | Attending: Surgery | Admitting: Physical Therapy

## 2023-02-18 ENCOUNTER — Inpatient Hospital Stay: Payer: Medicare PPO | Admitting: Licensed Clinical Social Worker

## 2023-02-18 ENCOUNTER — Inpatient Hospital Stay (HOSPITAL_BASED_OUTPATIENT_CLINIC_OR_DEPARTMENT_OTHER): Payer: Medicare PPO | Admitting: Hematology and Oncology

## 2023-02-18 ENCOUNTER — Inpatient Hospital Stay: Payer: Medicare PPO | Attending: Hematology and Oncology

## 2023-02-18 VITALS — BP 164/97 | HR 106 | Temp 97.5°F | Resp 18 | Ht 63.0 in | Wt 114.2 lb

## 2023-02-18 DIAGNOSIS — Z171 Estrogen receptor negative status [ER-]: Secondary | ICD-10-CM

## 2023-02-18 DIAGNOSIS — M069 Rheumatoid arthritis, unspecified: Secondary | ICD-10-CM

## 2023-02-18 DIAGNOSIS — R293 Abnormal posture: Secondary | ICD-10-CM | POA: Diagnosis present

## 2023-02-18 DIAGNOSIS — I10 Essential (primary) hypertension: Secondary | ICD-10-CM | POA: Insufficient documentation

## 2023-02-18 DIAGNOSIS — C50412 Malignant neoplasm of upper-outer quadrant of left female breast: Secondary | ICD-10-CM | POA: Diagnosis not present

## 2023-02-18 DIAGNOSIS — Z803 Family history of malignant neoplasm of breast: Secondary | ICD-10-CM

## 2023-02-18 LAB — CMP (CANCER CENTER ONLY)
ALT: 16 U/L (ref 0–44)
AST: 26 U/L (ref 15–41)
Albumin: 4.3 g/dL (ref 3.5–5.0)
Alkaline Phosphatase: 78 U/L (ref 38–126)
Anion gap: 7 (ref 5–15)
BUN: 18 mg/dL (ref 8–23)
CO2: 29 mmol/L (ref 22–32)
Calcium: 9.3 mg/dL (ref 8.9–10.3)
Chloride: 105 mmol/L (ref 98–111)
Creatinine: 0.82 mg/dL (ref 0.44–1.00)
GFR, Estimated: >60 mL/min (ref >60)
Glucose, Bld: 107 mg/dL — ABNORMAL HIGH (ref 70–99)
Potassium: 3.8 mmol/L (ref 3.5–5.1)
Sodium: 141 mmol/L (ref 135–145)
Total Bilirubin: 0.3 mg/dL (ref 0.3–1.2)
Total Protein: 7.6 g/dL (ref 6.5–8.1)

## 2023-02-18 LAB — CBC WITH DIFFERENTIAL (CANCER CENTER ONLY)
Abs Immature Granulocytes: 0.01 10*3/uL (ref 0.00–0.07)
Basophils Absolute: 0 10*3/uL (ref 0.0–0.1)
Basophils Relative: 0 %
Eosinophils Absolute: 0.1 10*3/uL (ref 0.0–0.5)
Eosinophils Relative: 3 %
HCT: 37.2 % (ref 36.0–46.0)
Hemoglobin: 12.4 g/dL (ref 12.0–15.0)
Immature Granulocytes: 0 %
Lymphocytes Relative: 41 %
Lymphs Abs: 1.8 10*3/uL (ref 0.7–4.0)
MCH: 32.9 pg (ref 26.0–34.0)
MCHC: 33.3 g/dL (ref 30.0–36.0)
MCV: 98.7 fL (ref 80.0–100.0)
Monocytes Absolute: 0.4 10*3/uL (ref 0.1–1.0)
Monocytes Relative: 8 %
Neutro Abs: 2.2 10*3/uL (ref 1.7–7.7)
Neutrophils Relative %: 48 %
Platelet Count: 192 10*3/uL (ref 150–400)
RBC: 3.77 MIL/uL — ABNORMAL LOW (ref 3.87–5.11)
RDW: 11.4 % — ABNORMAL LOW (ref 11.5–15.5)
WBC Count: 4.5 10*3/uL (ref 4.0–10.5)
nRBC: 0 % (ref 0.0–0.2)

## 2023-02-18 LAB — GENETIC SCREENING ORDER

## 2023-02-18 NOTE — H&P (Signed)
Subjective    Chief Complaint: Breast Cancer   Breast [facility] 02/18/23 [place]/ [place]   History of Present Illness: Heather Roberson is a 72 y.o. female who is seen today as an office consultation at the request of Dr. Al Pimple for evaluation of Breast Cancer .       This is a 72 year old female with multiple medical issues who presents with recent discovery of a palpable mass in the left upper outer quadrant of her breast near the axilla.  She underwent mammogram and ultrasound that revealed a 0.7 x 0.6 x 0.8 cm mass located at 0100, 10 cmfn.  Biopsy revealed IDC-3, triple negative, Ki67 80%.  No family history of breast cancer.  No previous breast problems.     Review of Systems: A complete review of systems was obtained from the patient.  I have reviewed this information and discussed as appropriate with the patient.  See HPI as well for other ROS.   Review of Systems  Constitutional:  Positive for weight loss.  HENT: Negative.    Eyes: Negative.   Respiratory: Negative.    Cardiovascular: Negative.   Gastrointestinal: Negative.   Genitourinary: Negative.   Musculoskeletal: Negative.   Skin: Negative.   Neurological: Negative.   Endo/Heme/Allergies: Negative.   Psychiatric/Behavioral: Negative.          Medical History: Past Medical History      Past Medical History:  Diagnosis Date   Arthritis     History of cancer     Hypertension          Problem List     Patient Active Problem List  Diagnosis   Connective tissue disease (CMS/HHS-HCC)   Essential hypertension   Malignant neoplasm of upper-outer quadrant of left breast in female, estrogen receptor negative (CMS/HHS-HCC)   Infiltrating ductal carcinoma of left female breast (CMS/HHS-HCC)   Multiple joint pain   Osteoarthritis   Osteopenia   Rheumatoid arthritis (CMS/HHS-HCC)   Paronychia of right thumb        Past Surgical History       Past Surgical History:  Procedure Laterality Date   left breast biopsy   Right 02/09/2023        Allergies       Allergies  Allergen Reactions   Other Cough      "SOME FOODS" (cannot name specific foods, however)        Medications Ordered Prior to Encounter        Current Outpatient Medications on File Prior to Visit  Medication Sig Dispense Refill   leflunomide (ARAVA) 10 MG tablet Take 10 mg by mouth once daily       levocetirizine (XYZAL) 5 MG tablet Take 5 mg by mouth every evening       amLODIPine (NORVASC) 2.5 MG tablet Take 2.5 mg by mouth once daily       ascorbic acid, vitamin C, (VITAMIN C) 500 MG tablet Take 500 mg by mouth once daily       cholecalciferol (VITAMIN D3) 1000 unit tablet Take 1,000 Units by mouth once daily       cyanocobalamin, vitamin B-12, (CYANOCOBALAMIN,VIT B-12,,BULK,) Powd Take 1 tablet by mouth once daily       ferrous sulfate 325 (65 FE) MG tablet Take 325 mg by mouth daily with breakfast       fluticasone propionate (FLONASE) 50 mcg/actuation nasal spray Place 2 sprays into both nostrils once daily       hydroxychloroquine (PLAQUENIL) 200  mg tablet Take 1 tablet by mouth once daily       mirtazapine (REMERON) 7.5 MG tablet Take 7.5 mg by mouth at bedtime        No current facility-administered medications on file prior to visit.        Family History  Family History  Family history unknown: Yes        Tobacco Use History  Social History       Tobacco Use  Smoking Status Never  Smokeless Tobacco Never        Social History  Social History        Socioeconomic History   Marital status: Divorced  Tobacco Use   Smoking status: Never   Smokeless tobacco: Never  Vaping Use   Vaping status: Never Used  Substance and Sexual Activity   Alcohol use: Never   Drug use: Never        Objective:    Physical Exam    Constitutional:  WDWN in NAD, conversant, no obvious deformities; lying in bed comfortably Eyes:  Pupils equal, round; sclera anicteric; moist conjunctiva; no lid lag HENT:  Oral  mucosa moist; good dentition  Neck:  No masses palpated, trachea midline; no thyromegaly Lungs:  CTA bilaterally; normal respiratory effort Breasts:  symmetric, no nipple changes; no palpable masses or lymphadenopathy on right side.  Left breast reveals some ecchymosis and palpable firmness in left upper outer quadrant near the axilla.  No palpable lymphadenopathy. CV:  Regular rate and rhythm; no murmurs; extremities well-perfused with no edema Abd:  +bowel sounds, soft, non-tender, no palpable organomegaly; no palpable hernias Musc:  Normal gait; no apparent clubbing or cyanosis in extremities Lymphatic:  No palpable cervical or axillary lymphadenopathy Skin:  Warm, dry; no sign of jaundice Psychiatric - alert and oriented x 4; calm mood and affect     Labs, Imaging and Diagnostic Testing:   Diagnosis Breast, left, needle core biopsy, 1:00 10 cmfn, coil clip INVASIVE DUCTAL CARCINOMA, SEE NOTE DUCTAL CARCINOMA IN SITU, SOLID TYPE, NUCLEAR GRADE 3 TUBULE FORMATION: SCORE 3 NUCLEAR PLEOMORPHISM: SCORE 3 MITOTIC COUNT: SCORE 2 TOTAL SCORE: 8/9 OVERALL GRADE: 3 LYMPHOVASCULAR INVASION: NOT IDENTIFIED CANCER LENGTH: > 7 MM CALCIFICATIONS: PRESENT OTHER FINDINGS: ADENOSIS SEEN NOTE NOTE: DR. PICKLESIMER REVIEWED THE CASE AND CONCURS WITH THE INTERPRETATION. A BREAST PROGNOSTIC PROFILE (ER AND PR) IS PENDING AND WILL BE REPORTED IN AN ADDENDUM. [facility] WAS NOTIFIED 02/10/2023. Isac Caddy MD [facility] Pathologist, Electronic Signature (Case signed 02/10/2023)   PROGNOSTIC INDICATORS Results: IMMUNOHISTOCHEMICAL AND MORPHOMETRIC ANALYSIS PERFORMED MANUALLY The tumor cells are negative for Her2 (0). Estrogen Receptor: 0%, NEGATIVE Progesterone Receptor: 0%, NEGATIVE Proliferation Marker Ki67: 80% COMMENT: The negative hormone receptor study(ies) in this case has an internal positive control. REFERENCE RANGE ESTROGEN RECEPTOR NEGATIVE 0% POSITIVE  =>1% REFERENCE RANGE PROGESTERONE RECEPTOR NEGATIVE 0% POSITIVE =>1% All controls stained appropriately Jimmy Picket MD Pathologist, Electronic Signature ( Signed 02/11/2023)   CLINICAL DATA:  72 year old female presenting for evaluation of a new lump in the left breast. Patient is also due for annual exam of the right breast.   EXAM: DIGITAL DIAGNOSTIC BILATERAL MAMMOGRAM WITH TOMOSYNTHESIS; ULTRASOUND LEFT BREAST LIMITED   TECHNIQUE: Bilateral digital diagnostic mammography and breast tomosynthesis was performed.; Targeted ultrasound examination of the left breast was performed.   COMPARISON:  Previous exam(s).   ACR Breast Density Category c: The breasts are heterogeneously dense, which may obscure small masses.   FINDINGS: Mammogram:   Right breast:  No suspicious mass, distortion, or microcalcifications are identified to suggest presence of malignancy.   Left breast: There is a skin BB marking the palpable site of concern reported by the patient in the upper outer posterior left breast. A spot tangential view of this area was performed in addition to standard views. At the palpable site there is an irregular mass measuring approximately 0.6 cm. No new suspicious findings elsewhere in the left breast.   On physical exam at site of concern reported by the patient in the upper-outer left breast I feel a discrete mass.   Ultrasound:   Targeted ultrasound performed in the left breast at 1 o'clock 10 cm from the nipple demonstrating an irregular hypoechoic mass measuring 0.7 x 0.6 x 0.8 cm. This corresponds to the mammographic finding.   Targeted ultrasound the left axilla demonstrates normal lymph nodes.   IMPRESSION: Highly suspicious mass in the left breast at 1 o'clock measuring 0.8 cm.   RECOMMENDATION: Ultrasound-guided core needle biopsy x1 of the left breast.   I have discussed the findings and recommendations with the patient. If applicable, a  reminder letter will be sent to the patient regarding the next appointment.   BI-RADS CATEGORY  5: Highly suggestive of malignancy.     Electronically Signed   By: Emmaline Kluver M.D.   On: 01/28/2023 12:18   Assessment and Plan:  Diagnoses and all orders for this visit:   Malignant neoplasm of upper-outer quadrant of left breast in female, estrogen receptor negative (CMS/HHS-HCC)     I spent some time with the patient and her daughter discussing her disease and recommended surgical options.  Since she is triple-negative, Oncology is recommend chemotherapy.  We will plan a left breast radioactive seed localized lumpectomy/ left axillary sentinel lymph node biopsy/ ultrasound-guided port placement.  The surgical procedure has been discussed with the patient.  Potential risks, benefits, alternative treatments, and expected outcomes have been explained.  All of the patient's questions at this time have been answered.  The likelihood of reaching the patient's treatment goal is good.  The patient understand the proposed surgical procedure and wishes to proceed.     Aayush Gelpi Delbert Harness, MD  02/18/2023 11:03 AM

## 2023-02-18 NOTE — Progress Notes (Signed)
Radiation Oncology         [phone] ________________________________  Multidisciplinary Breast Oncology Clinic [facility]) Initial Outpatient Consultation  Name: Heather Roberson MRN: [id number]  Date: 02/18/2023  DOB: January 09, 1951  WU:[id number], Jonny Ruiz, MD  Manus Rudd, MD   REFERRING PHYSICIAN: Manus Rudd, MD  DIAGNOSIS: The encounter diagnosis was Malignant neoplasm of upper-outer quadrant of left breast in female, estrogen receptor negative (HCC).  Stage IB (cT1b, cN0, cM0)  Left Breast UOQ, Invasive and in situ carcinoma, ER- / PR- / Her2-, Grade 3    ICD-10-CM   1. Malignant neoplasm of upper-outer quadrant of left breast in female, estrogen receptor negative (HCC)  C50.412    Z17.1       HISTORY OF PRESENT ILLNESS::Heather Roberson is a 72 y.o. female who is presenting to the office today for evaluation of her newly diagnosed breast cancer. She is accompanied by her daughter. She is doing well overall.   She initially presented with a palpable left breast lump earlier this month. She underwent bilateral diagnostic mammography with tomography and left breast ultrason[facility]23 showing: a highly suspicious mass in the 1 o'clock left breast located 10 cmfn, measuring 8 mm in the greatest extent, correlating with the site of concern.  Biopsy of the 1 o'clock left breast on 02/09/23 showed: grade 3 invasive ductal carcinoma measuring >68mm in the greatest linear extent of the sample, with high grade DCIS. Prognostic indicators significant for: estrogen receptor, 0% negative and progesterone receptor, 0% negative. Proliferation marker Ki67 at 80%. HER2 negative.   Age at first live birth: 71 years old GP: 3 HRT: never used   The patient was referred today for presentation in the multidisciplinary conference.  Radiology studies and pathology slides were presented there for review and discussion of treatment options.  A consensus was discussed  regarding potential next steps.  PREVIOUS RADIATION THERAPY: No  PAST MEDICAL HISTORY:  Past Medical History:  Diagnosis Date   Arthritis   Rheumatoid arthritis on Plaquenil  PAST SURGICAL HISTORY: Past Surgical History:  Procedure Laterality Date   BREAST BIOPSY Left 02/09/2023   [place] LT BREAST BX W LOC DEV 1ST LESION IMG BX SPEC US GUIDE 02/09/2023 [facility]    FAMILY HISTORY:  Family History  Family history unknown: Yes    SOCIAL HISTORY:  Social History   Socioeconomic History   Marital status: Divorced    Spouse name: Not on file   Number of children: Not on file   Years of education: Not on file   Highest education level: Not on file  Occupational History   Not on file  Tobacco Use   Smoking status: Never   Smokeless tobacco: Not on file  Substance and Sexual Activity   Alcohol use: No   Drug use: No   Sexual activity: Not on file  Other Topics Concern   Not on file  Social History Narrative   Not on file   Social Determinants of Health   Financial Resource Strain: Not on file  Food Insecurity: Not on file  Transportation Needs: Not on file  Physical Activity: Not on file  Stress: Not on file  Social Connections: Not on file    ALLERGIES:  Allergies  Allergen Reactions   Other Cough    "SOME FOODS" (cannot name specific foods, however)    MEDICATIONS:  Current Outpatient Medications  Medication Sig Dispense Refill   Abatacept (ORENCIA) 125 MG/ML SOSY 1 ml  amLODipine (NORVASC) 2.5 MG tablet amlodipine 2.5 mg tablet     Ascorbic Acid (VITAMIN C PO) Take 1 tablet by mouth daily.     ascorbic acid (VITAMIN C) 500 MG tablet Take by mouth.     benzonatate (TESSALON) 100 MG capsule 1 capsule as needed     brompheniramine-pseudoephedrine-DM 30-2-10 MG/5ML syrup Take by mouth.     Calcium Carb-Cholecalciferol (CALCIUM 600+D) 600-10 MG-MCG TABS 1 tablet with food     CALCIUM PO Take 1 tablet by mouth daily.     carbamide peroxide (DEBROX)  6.5 % OTIC solution Place 5 drops into both ears 2 (two) times daily. 15 mL 0   Carboxymethylcellul-Glycerin (CLEAR EYES FOR DRY EYES OP) Place 1-2 drops into both eyes 2 (two) times daily as needed (for dryness).     cholecalciferol (VITAMIN D) 1000 units tablet Take 1,000 Units by mouth daily.     Cyanocobalamin (B-12 PO) Take 1 tablet by mouth daily.     ferrous sulfate 325 (65 FE) MG tablet Take 325 mg by mouth daily with breakfast.     fluticasone (FLONASE ALLERGY RELIEF) 50 MCG/ACT nasal spray 2 sprays in each nostril     fluticasone (FLONASE) 50 MCG/ACT nasal spray Place 1-2 sprays into both nostrils daily as needed for allergies or rhinitis.     HUMIRA PEN 40 MG/0.8ML PNKT One injection every other week  2   hydroxychloroquine (PLAQUENIL) 200 MG tablet Take 2 tablets by mouth every morning.  0   hydroxychloroquine (PLAQUENIL) 200 MG tablet Plaquenil 200 mg tablet  Take 1 tablet every day by oral route.     leflunomide (ARAVA) 10 MG tablet Take 1 tablet by mouth daily.     levocetirizine (XYZAL) 5 MG tablet Take by mouth.     Multiple Vitamins-Minerals (ONE-A-DAY WOMENS 50 PLUS PO) Take 1 tablet by mouth daily after breakfast.     No current facility-administered medications for this encounter.    REVIEW OF SYSTEMS: A 10+ POINT REVIEW OF SYSTEMS WAS OBTAINED including neurology, dermatology, psychiatry, cardiac, respiratory, lymph, extremities, GI, GU, musculoskeletal, constitutional, reproductive, HEENT.    PHYSICAL EXAM:    02/18/2023  Vitals with BMI   Height 5\' 3"    Weight 114 lbs 3 oz   BMI 20.23   Systolic 164 !   Diastolic 97 (H)   Pulse 106 !     Legend: ! Abnormal (H) High  Lungs are clear to auscultation bilaterally. Heart has regular rate and rhythm. No palpable cervical, supraclavicular, or axillary adenopathy. Abdomen soft, non-tender, normal bowel sounds. Breast: Right breast with no palpable mass, nipple discharge, or bleeding. Left breast with some bruising  and a biopsy site in the lateral aspect of the breast area. No dominant mass nipple discharge or bleeding. Both nipples appear to be partially inverted.  KPS = 80  100 - Normal; no complaints; no evidence of disease. 90   - Able to carry on normal activity; minor signs or symptoms of disease. 80   - Normal activity with effort; some signs or symptoms of disease. 63   - Cares for self; unable to carry on normal activity or to do active work. 60   - Requires occasional assistance, but is able to care for most of his personal needs. 50   - Requires considerable assistance and frequent medical care. 40   - Disabled; requires special care and assistance. 30   - Severely disabled; hospital admission is indicated although death not imminent. 20   -  Very sick; hospital admission necessary; active supportive treatment necessary. 10   - Moribund; fatal processes progressing rapidly. 0     - Dead  Karnofsky DA, Abelmann WH, Craver LS and Burchenal [facility] [phone]) The use of the nitrogen mustards in the palliative treatment of carcinoma: with particular reference to bronchogenic carcinoma Cancer 1 634-56  LABORATORY DATA:  Lab Results  Component Value Date   WBC 4.5 02/18/2023   HGB 12.4 02/18/2023   HCT 37.2 02/18/2023   MCV 98.7 02/18/2023   PLT 192 02/18/2023   Lab Results  Component Value Date   NA 141 02/18/2023   K 3.8 02/18/2023   CL 105 02/18/2023   CO2 29 02/18/2023   Lab Results  Component Value Date   ALT 16 02/18/2023   AST 26 02/18/2023   ALKPHOS 78 02/18/2023   BILITOT 0.3 02/18/2023    PULMONARY FUNCTION TEST:   Review Flowsheet        No data to display          RADIOGRAPHY: [place] LT BREAST BX W LOC DEV 1ST LESION IMG BX SPEC US GUIDE  Addendum Date: 02/13/2023   ADDENDUM REPORT: 02/13/2023 15:03 ADDENDUM: Pathology revealed GRADE III INVASIVE DUCTAL CARCINOMA, NUCLEAR GRADE 3 DUCTAL CARCINOMA IN SITU, SOLID TYPE, CALCIFICATIONS: PRESENT, OTHER FINDINGS: ADENOSIS of  the LEFT breast 1:00, 10 cmfn, (coil clip). This was found to be concordant by Dr. Amie Portland. Pathology results were discussed with the patient and her daughter Jetty Duhamel by telephone, per request. The patient reported doing well after the biopsy with tenderness and oozing at the site. Post biopsy instructions and care were reviewed and questions were answered. The patient was encouraged to call [facility] for any additional concerns. My direct phone number was provided. The patient was referred to [facility] on Feb 18, 2023. Pathology results reported by Rene Kocher, RN on 02/13/2023. Electronically Signed   By: Amie Portland M.D.   On: 02/13/2023 15:03   Result Date: 02/13/2023 CLINICAL DATA:  Patient presents for ultrasound-guided core needle biopsy of a left breast mass. EXAM: ULTRASOUND GUIDED LEFT BREAST CORE NEEDLE BIOPSY COMPARISON:  Previous exam(s). PROCEDURE: I met with the patient and we discussed the procedure of ultrasound-guided biopsy, including benefits and alternatives. We discussed the high likelihood of a successful procedure. We discussed the risks of the procedure, including infection, bleeding, tissue injury, clip migration, and inadequate sampling. Informed written consent was given. The usual time-out protocol was performed immediately prior to the procedure. Lesion quadrant: Upper outer quadrant Using sterile technique and 1% Lidocaine as local anesthetic, under direct ultrasound visualization, a 12 gauge spring-loaded device was used to perform biopsy of the 7 mm mass 1 o'clock using an inferior approach. At the conclusion of the procedure a coil shaped tissue marker clip was deployed into the biopsy cavity. Follow up 2 view mammogram was performed and dictated separately. IMPRESSION: Ultrasound guided biopsy of a left breast mass. No apparent complications. Electronically  Signed: By: Amie Portland M.D. On: 02/09/2023 13:27  MM CLIP PLACEMENT LEFT  Result Date: 02/09/2023 CLINICAL DATA:  Assess post biopsy marker clip placement following ultrasound-guided core needle biopsy of a left breast mass. EXAM: 3D DIAGNOSTIC LEFT MAMMOGRAM POST ULTRASOUND BIOPSY COMPARISON:  Previous exam(s). FINDINGS: 3D Mammographic images were obtained following ultrasound guided biopsy of a left breast mass. The biopsy marking clip is in expected position at the  site of biopsy. IMPRESSION: Appropriate positioning of the coil shaped biopsy marking clip at the site of biopsy in the within the mass in the upper outer left breast. Final Assessment: Post Procedure Mammograms for Marker Placement Electronically Signed   By: Amie Portland M.D.   On: 02/09/2023 13:48  MM 3D DIAGNOSTIC MAMMOGRAM BILATERAL BREAST  Result Date: 01/28/2023 CLINICAL DATA:  72 year old female presenting for evaluation of a new lump in the left breast. Patient is also due for annual exam of the right breast. EXAM: DIGITAL DIAGNOSTIC BILATERAL MAMMOGRAM WITH TOMOSYNTHESIS; ULTRASOUND LEFT BREAST LIMITED TECHNIQUE: Bilateral digital diagnostic mammography and breast tomosynthesis was performed.; Targeted ultrasound examination of the left breast was performed. COMPARISON:  Previous exam(s). ACR Breast Density Category c: The breasts are heterogeneously dense, which may obscure small masses. FINDINGS: Mammogram: Right breast: No suspicious mass, distortion, or microcalcifications are identified to suggest presence of malignancy. Left breast: There is a skin BB marking the palpable site of concern reported by the patient in the upper outer posterior left breast. A spot tangential view of this area was performed in addition to standard views. At the palpable site there is an irregular mass measuring approximately 0.6 cm. No new suspicious findings elsewhere in the left breast. On physical exam at site of concern reported by the  patient in the upper-outer left breast I feel a discrete mass. Ultrasound: Targeted ultrasound performed in the left breast at 1 o'clock 10 cm from the nipple demonstrating an irregular hypoechoic mass measuring 0.7 x 0.6 x 0.8 cm. This corresponds to the mammographic finding. Targeted ultrasound the left axilla demonstrates normal lymph nodes. IMPRESSION: Highly suspicious mass in the left breast at 1 o'clock measuring 0.8 cm. RECOMMENDATION: Ultrasound-guided core needle biopsy x1 of the left breast. I have discussed the findings and recommendations with the patient. If applicable, a reminder letter will be sent to the patient regarding the next appointment. BI-RADS CATEGORY  5: Highly suggestive of malignancy. Electronically Signed   By: Emmaline Kluver M.D.   On: 01/28/2023 12:18  [place] LIMITED ULTRASOUND INCLUDING AXILLA LEFT BREAST   Result Date: 01/28/2023 CLINICAL DATA:  72 year old female presenting for evaluation of a new lump in the left breast. Patient is also due for annual exam of the right breast. EXAM: DIGITAL DIAGNOSTIC BILATERAL MAMMOGRAM WITH TOMOSYNTHESIS; ULTRASOUND LEFT BREAST LIMITED TECHNIQUE: Bilateral digital diagnostic mammography and breast tomosynthesis was performed.; Targeted ultrasound examination of the left breast was performed. COMPARISON:  Previous exam(s). ACR Breast Density Category c: The breasts are heterogeneously dense, which may obscure small masses. FINDINGS: Mammogram: Right breast: No suspicious mass, distortion, or microcalcifications are identified to suggest presence of malignancy. Left breast: There is a skin BB marking the palpable site of concern reported by the patient in the upper outer posterior left breast. A spot tangential view of this area was performed in addition to standard views. At the palpable site there is an irregular mass measuring approximately 0.6 cm. No new suspicious findings elsewhere in the left breast. On physical exam at site of concern  reported by the patient in the upper-outer left breast I feel a discrete mass. Ultrasound: Targeted ultrasound performed in the left breast at 1 o'clock 10 cm from the nipple demonstrating an irregular hypoechoic mass measuring 0.7 x 0.6 x 0.8 cm. This corresponds to the mammographic finding. Targeted ultrasound the left axilla demonstrates normal lymph nodes. IMPRESSION: Highly suspicious mass in the left breast at 1 o'clock measuring 0.8 cm. RECOMMENDATION: Ultrasound-guided core needle  biopsy x1 of the left breast. I have discussed the findings and recommendations with the patient. If applicable, a reminder letter will be sent to the patient regarding the next appointment. BI-RADS CATEGORY  5: Highly suggestive of malignancy. Electronically Signed   By: Emmaline Kluver M.D.   On: 01/28/2023 12:18     IMPRESSION: Stage IB (cT1b, cN0, cM0)  Left Breast UOQ, Invasive and in situ carcinoma, ER- / PR- / Her2-, Grade 3   Patient will be a good candidate for breast conservation with radiotherapy to the left breast. We discussed the general course of radiation, potential side effects, and toxicities with radiation and the patient is interested in this approach.    PLAN:  Genetics  Left lumpectomy with SLN biopsies and port placement  Adjuvant chemotherapy  Adjuvant radiation therapy    ------------------------------------------------  Billie Lade, PhD, MD  This document serves as a record of services personally performed by Antony Blackbird, MD. It was created on his behalf by Neena Rhymes, a trained medical scribe. The creation of this record is based on the scribe's personal observations and the provider's statements to them. This document has been checked and approved by the attending provider.

## 2023-02-18 NOTE — H&P (View-Only) (Signed)
Subjective    Chief Complaint: Breast Cancer   Breast MDC 02/18/23 Iruku/ [place]   History of Present Illness: Heather Roberson is a 71 y.o. female who is seen today as an office consultation at the request of Dr. Iruku for evaluation of Breast Cancer .       This is a 71 year old female with multiple medical issues who presents with recent discovery of a palpable mass in the left upper outer quadrant of her breast near the axilla.  She underwent mammogram and ultrasound that revealed a 0.7 x 0.6 x 0.8 cm mass located at 0100, 10 cmfn.  Biopsy revealed IDC-3, triple negative, Ki67 80%.  No family history of breast cancer.  No previous breast problems.     Review of Systems: A complete review of systems was obtained from the patient.  I have reviewed this information and discussed as appropriate with the patient.  See HPI as well for other ROS.   Review of Systems  Constitutional:  Positive for weight loss.  HENT: Negative.    Eyes: Negative.   Respiratory: Negative.    Cardiovascular: Negative.   Gastrointestinal: Negative.   Genitourinary: Negative.   Musculoskeletal: Negative.   Skin: Negative.   Neurological: Negative.   Endo/Heme/Allergies: Negative.   Psychiatric/Behavioral: Negative.          Medical History: Past Medical History      Past Medical History:  Diagnosis Date   Arthritis     History of cancer     Hypertension          Problem List     Patient Active Problem List  Diagnosis   Connective tissue disease (CMS/HHS-HCC)   Essential hypertension   Malignant neoplasm of upper-outer quadrant of left breast in female, estrogen receptor negative (CMS/HHS-HCC)   Infiltrating ductal carcinoma of left female breast (CMS/HHS-HCC)   Multiple joint pain   Osteoarthritis   Osteopenia   Rheumatoid arthritis (CMS/HHS-HCC)   Paronychia of right thumb        Past Surgical History       Past Surgical History:  Procedure Laterality Date   left breast biopsy   Right 02/09/2023        Allergies       Allergies  Allergen Reactions   Other Cough      "SOME FOODS" (cannot name specific foods, however)        Medications Ordered Prior to Encounter        Current Outpatient Medications on File Prior to Visit  Medication Sig Dispense Refill   leflunomide (ARAVA) 10 MG tablet Take 10 mg by mouth once daily       levocetirizine (XYZAL) 5 MG tablet Take 5 mg by mouth every evening       amLODIPine (NORVASC) 2.5 MG tablet Take 2.5 mg by mouth once daily       ascorbic acid, vitamin C, (VITAMIN C) 500 MG tablet Take 500 mg by mouth once daily       cholecalciferol (VITAMIN D3) 1000 unit tablet Take 1,000 Units by mouth once daily       cyanocobalamin, vitamin B-12, (CYANOCOBALAMIN,VIT B-12,,BULK,) Powd Take 1 tablet by mouth once daily       ferrous sulfate 325 (65 FE) MG tablet Take 325 mg by mouth daily with breakfast       fluticasone propionate (FLONASE) 50 mcg/actuation nasal spray Place 2 sprays into both nostrils once daily       hydroxychloroquine (PLAQUENIL) 200   mg tablet Take 1 tablet by mouth once daily       mirtazapine (REMERON) 7.5 MG tablet Take 7.5 mg by mouth at bedtime        No current facility-administered medications on file prior to visit.        Family History  Family History  Family history unknown: Yes        Tobacco Use History  Social History       Tobacco Use  Smoking Status Never  Smokeless Tobacco Never        Social History  Social History        Socioeconomic History   Marital status: Divorced  Tobacco Use   Smoking status: Never   Smokeless tobacco: Never  Vaping Use   Vaping status: Never Used  Substance and Sexual Activity   Alcohol use: Never   Drug use: Never        Objective:    Physical Exam    Constitutional:  WDWN in NAD, conversant, no obvious deformities; lying in bed comfortably Eyes:  Pupils equal, round; sclera anicteric; moist conjunctiva; no lid lag HENT:  Oral  mucosa moist; good dentition  Neck:  No masses palpated, trachea midline; no thyromegaly Lungs:  CTA bilaterally; normal respiratory effort Breasts:  symmetric, no nipple changes; no palpable masses or lymphadenopathy on right side.  Left breast reveals some ecchymosis and palpable firmness in left upper outer quadrant near the axilla.  No palpable lymphadenopathy. CV:  Regular rate and rhythm; no murmurs; extremities well-perfused with no edema Abd:  +bowel sounds, soft, non-tender, no palpable organomegaly; no palpable hernias Musc:  Normal gait; no apparent clubbing or cyanosis in extremities Lymphatic:  No palpable cervical or axillary lymphadenopathy Skin:  Warm, dry; no sign of jaundice Psychiatric - alert and oriented x 4; calm mood and affect     Labs, Imaging and Diagnostic Testing:   Diagnosis Breast, left, needle core biopsy, 1:00 10 cmfn, coil clip INVASIVE DUCTAL CARCINOMA, SEE NOTE DUCTAL CARCINOMA IN SITU, SOLID TYPE, NUCLEAR GRADE 3 TUBULE FORMATION: SCORE 3 NUCLEAR PLEOMORPHISM: SCORE 3 MITOTIC COUNT: SCORE 2 TOTAL SCORE: 8/9 OVERALL GRADE: 3 LYMPHOVASCULAR INVASION: NOT IDENTIFIED CANCER LENGTH: > 7 MM CALCIFICATIONS: PRESENT OTHER FINDINGS: ADENOSIS SEEN NOTE NOTE: DR. PICKLESIMER REVIEWED THE CASE AND CONCURS WITH THE INTERPRETATION. A BREAST PROGNOSTIC PROFILE (ER AND PR) IS PENDING AND WILL BE REPORTED IN AN ADDENDUM. THE [facility] WAS NOTIFIED 02/10/2023. Haiyan MD Lu Pathologist, Electronic Signature (Case signed 02/10/2023)   PROGNOSTIC INDICATORS Results: IMMUNOHISTOCHEMICAL AND MORPHOMETRIC ANALYSIS PERFORMED MANUALLY The tumor cells are negative for Her2 (0). Estrogen Receptor: 0%, NEGATIVE Progesterone Receptor: 0%, NEGATIVE Proliferation Marker Ki67: 80% COMMENT: The negative hormone receptor study(ies) in this case has an internal positive control. REFERENCE RANGE ESTROGEN RECEPTOR NEGATIVE 0% POSITIVE  =>1% REFERENCE RANGE PROGESTERONE RECEPTOR NEGATIVE 0% POSITIVE =>1% All controls stained appropriately JOHN PATRICK MD Pathologist, Electronic Signature ( Signed 02/11/2023)   CLINICAL DATA:  71-year-old female presenting for evaluation of a new lump in the left breast. Patient is also due for annual exam of the right breast.   EXAM: DIGITAL DIAGNOSTIC BILATERAL MAMMOGRAM WITH TOMOSYNTHESIS; ULTRASOUND LEFT BREAST LIMITED   TECHNIQUE: Bilateral digital diagnostic mammography and breast tomosynthesis was performed.; Targeted ultrasound examination of the left breast was performed.   COMPARISON:  Previous exam(s).   ACR Breast Density Category c: The breasts are heterogeneously dense, which may obscure small masses.   FINDINGS: Mammogram:   Right breast:   No suspicious mass, distortion, or microcalcifications are identified to suggest presence of malignancy.   Left breast: There is a skin BB marking the palpable site of concern reported by the patient in the upper outer posterior left breast. A spot tangential view of this area was performed in addition to standard views. At the palpable site there is an irregular mass measuring approximately 0.6 cm. No new suspicious findings elsewhere in the left breast.   On physical exam at site of concern reported by the patient in the upper-outer left breast I feel a discrete mass.   Ultrasound:   Targeted ultrasound performed in the left breast at 1 o'clock 10 cm from the nipple demonstrating an irregular hypoechoic mass measuring 0.7 x 0.6 x 0.8 cm. This corresponds to the mammographic finding.   Targeted ultrasound the left axilla demonstrates normal lymph nodes.   IMPRESSION: Highly suspicious mass in the left breast at 1 o'clock measuring 0.8 cm.   RECOMMENDATION: Ultrasound-guided core needle biopsy x1 of the left breast.   I have discussed the findings and recommendations with the patient. If applicable, a  reminder letter will be sent to the patient regarding the next appointment.   BI-RADS CATEGORY  5: Highly suggestive of malignancy.     Electronically Signed   By: Nancy  Ballantyne M.D.   On: 01/28/2023 12:18   Assessment and Plan:  Diagnoses and all orders for this visit:   Malignant neoplasm of upper-outer quadrant of left breast in female, estrogen receptor negative (CMS/HHS-HCC)     I spent some time with the patient and her daughter discussing her disease and recommended surgical options.  Since she is triple-negative, Oncology is recommend chemotherapy.  We will plan a left breast radioactive seed localized lumpectomy/ left axillary sentinel lymph node biopsy/ ultrasound-guided port placement.  The surgical procedure has been discussed with the patient.  Potential risks, benefits, alternative treatments, and expected outcomes have been explained.  All of the patient's questions at this time have been answered.  The likelihood of reaching the patient's treatment goal is good.  The patient understand the proposed surgical procedure and wishes to proceed.     Percilla Tweten KAI Kamaal Cast, MD  02/18/2023 11:03 AM   

## 2023-02-18 NOTE — Assessment & Plan Note (Signed)
This is a very pleasant 72 yr old female patient with past medical history significant for hypertension, rheumatoid arthritis on hydroxychloroquine now diagnosed with left breast invasive ductal carcinoma, high-grade, triple negative referred to breast MDC for additional recommendations.  She arrived to the appointment today with her daughter.  At baseline she has some anxiety, some pain from the arthritis but she is very functional, lives independently and can take care of all her activities of daily living.  She palpated this mass in her left breast about 2 to 3 weeks ago.  She had an abnormal mammogram around the age of 4 for which she needed biopsy of the right breast but this was benign.  There is some family history of breast cancer in sister diagnosed at the age of 70.  No other family history of cancer.  Physical examination today with palpable breast lump in the upper outer quadrant of the left breast measuring just about a centimeter, no other regional adenopathy.  Postbiopsy changes noted in the left breast.  Given triple negative biology, we have discussed about adjuvant chemotherapy.  I have presented her with the option of lumpectomy followed by adjuvant docetaxel and cyclophosphamide every 21 days for 4 cycles followed by adjuvant radiation and observation with serial mammograms.  I have discussed adverse effects of chemotherapy including but not limited to fatigue, nausea, vomiting, diarrhea, increased risk of infections, neuropathy, alopecia etc.  She understands that the neuropathy can sometimes be permanent.  She is willing to consider all the options for treatment.  She will return to clinic after surgery to initiate chemotherapy.  We have discussed about port for chemotherapy administration, IV access and she is agreeable to port placement as well.  Thank you for consulting [place] in the care of this patient.  Please do not hesitate to contact us with any additional questions or concerns.

## 2023-02-18 NOTE — Progress Notes (Signed)
REFERRING PROVIDER: Rachel Moulds, MD  PRIMARY PROVIDER:  Kirby Funk, MD  PRIMARY REASON FOR VISIT:  Encounter Diagnosis  Name Primary?   Malignant neoplasm of upper-outer quadrant of left breast in female, estrogen receptor negative (HCC) Yes   HISTORY OF PRESENT ILLNESS:   Heather Roberson, a 72 y.o. female, was seen for a [facility] [place] cancer genetics consultation at the request of Dr. Al Pimple due to a personal history of cancer.  Heather Roberson presents to clinic today to discuss the possibility of a hereditary predisposition to cancer, to discuss genetic testing, and to further clarify her future cancer risks, as well as potential cancer risks for family members.   In April 2024, at the age of 72, Heather Roberson was diagnosed with invasive ductal carcinoma of the left breast (ER/PR/HER2 Negative).   Past Medical History:  Diagnosis Date   Arthritis     Past Surgical History:  Procedure Laterality Date   BREAST BIOPSY Left 02/09/2023   [place] LT BREAST BX W LOC DEV 1ST LESION IMG BX SPEC US GUIDE 02/09/2023 [facility]    Social History   Socioeconomic History   Marital status: Divorced    Spouse name: Not on file   Number of children: Not on file   Years of education: Not on file   Highest education level: Not on file  Occupational History   Not on file  Tobacco Use   Smoking status: Never   Smokeless tobacco: Not on file  Substance and Sexual Activity   Alcohol use: No   Drug use: No   Sexual activity: Not on file  Other Topics Concern   Not on file  Social History Narrative   Not on file   Social Determinants of Health   Financial Resource Strain: Not on file  Food Insecurity: Not on file  Transportation Needs: Not on file  Physical Activity: Not on file  Stress: Not on file  Social Connections: Not on file     FAMILY HISTORY:  We obtained a detailed, 4-generation family history.  Heather Roberson reports no known family history of cancer. She is unaware of  previous family history of genetic testing for hereditary cancer risks. There is no reported Ashkenazi Jewish ancestry.     GENETIC COUNSELING ASSESSMENT: Heather Roberson is a 72 y.o. female with a personal history of cancer which is somewhat suggestive of a hereditary predisposition to cancer given her triple negative breast cancer. We, therefore, discussed and recommended the following at today's visit.   DISCUSSION: We discussed that 5 - 10% of cancer is hereditary, with most cases of breast cancer associated with BRCA1/2.  There are other genes that can be associated with hereditary breast cancer syndromes.  We discussed that testing is beneficial for several reasons including knowing how to follow individuals after completing their treatment, identifying whether potential treatment options would be beneficial, and understanding if other family members could be at risk for cancer and allowing them to undergo genetic testing.   We reviewed the characteristics, features and inheritance patterns of hereditary cancer syndromes. We also discussed genetic testing, including the appropriate family members to test, the process of testing, insurance coverage and turn-around-time for results. We discussed the implications of a negative, positive, carrier and/or variant of uncertain significant result. We recommended Heather Roberson pursue genetic testing for a panel that includes genes associated with breast cancer.   Heather Roberson elected to have [facility] Custom Panel. The Custom Hereditary Cancers Panel offered by [facility] includes sequencing  and/or deletion duplication testing of the following 43 genes: APC, ATM, AXIN2, BAP1, BARD1, BMPR1A, BRCA1, BRCA2, BRIP1, CDH1, CDK4, CDKN2A (p14ARF and p16INK4a only), CHEK2, CTNNA1, EPCAM (Deletion/duplication testing only), FH, GREM1 (promoter region duplication testing only), HOXB13, KIT, MBD4, MEN1, MLH1, MSH2, MSH3, MSH6, MUTYH, NF1, NHTL1, PALB2, PDGFRA, PMS2, POLD1, POLE,  PTEN, RAD51C, RAD51D, SMAD4, SMARCA4. STK11, TP53, TSC1, TSC2, and VHL.  Based on Heather Roberson's personal history of cancer, she meets medical criteria for genetic testing. Despite that she meets criteria, she may still have an out of pocket cost. We discussed that if her out of pocket cost for testing is over $100, the laboratory will call and confirm whether she wants to proceed with testing.  If the out of pocket cost of testing is less than $100 she will be billed by the genetic testing laboratory.   PLAN: After considering the risks, benefits, and limitations, Heather Roberson provided informed consent to pursue genetic testing and the blood sample was sent to [facility] for analysis of the Custom Panel. Results should be available within approximately 2-3 weeks' time, at which point they will be disclosed by telephone to Heather Roberson, as will any additional recommendations warranted by these results. Heather Roberson will receive a summary of her genetic counseling visit and a copy of her results once available. This information will also be available in Epic.   Heather Roberson questions were answered to her satisfaction today. Our contact information was provided should additional questions or concerns arise. Thank you for the referral and allowing [place] to share in the care of your patient.   Heather Brothers, MS, [facility] Genetic Counselor [email or web address] (P) [phone]  The patient was seen for a total of 20 minutes in face-to-face genetic counseling.  The patient brought her daughter. Drs. Pamelia Hoit and/or Mosetta Putt were available to discuss this case as needed.   _______________________________________________________________________ For Office Staff:  Number of people involved in session: 2 Was an Intern/ student involved with case: no

## 2023-02-18 NOTE — Research (Signed)
[employer] 2021-05 - Specimen Collection Study to Evaluate Biomarkers in Subjects with Cancer   Patient Heather Roberson was identified by Dr Al Pimple as a potential candidate for the above listed study. This Clinical Research Nurse met with Heather Roberson, [id number], on 02/18/23 in a manner and location that ensures patient privacy to discuss participation in the above listed research study. Patient is Accompanied by her daughter . A copy of the informed consent document with embedded HIPAA language was provided to the patient. Patient reads, speaks, and understands [place].    Patient was provided with the business card of this Nurse and encouraged to contact the research team with any questions. Approximately 15 minutes were spent with the patient reviewing the informed consent documents.  Patient was provided the option of taking informed consent documents home to review and was encouraged to review at their convenience with their support network, including other care providers. Patient took the consent documents home to review.  A member of the research team will follow-up with the patient on Monday, 5/6.  Juanita Laster, RN, BSN, CPN Clinical Research Nurse I [phone]  02/18/2023 11:47 AM

## 2023-02-18 NOTE — Therapy (Signed)
OUTPATIENT PHYSICAL THERAPY BREAST CANCER BASELINE EVALUATION   Patient Name: Heather Roberson MRN: [id number] DOB:1950-10-23, 72 y.o., female Today's Date: 02/18/2023  END OF SESSION:  PT End of Session - 02/18/23 1136     Visit Number 1    Number of Visits 2    Date for PT Re-Evaluation 04/15/23    PT Start Time 0952    PT Stop Time 1000   Also saw pt from 1027-1052 for a total of 33 minutes   PT Time Calculation (min) 8 min    Activity Tolerance Patient tolerated treatment well    Behavior During Therapy [facility] for tasks assessed/performed             Past Medical History:  Diagnosis Date   Arthritis    Past Surgical History:  Procedure Laterality Date   BREAST BIOPSY Left 02/09/2023   [place] LT BREAST BX W LOC DEV 1ST LESION IMG BX SPEC US GUIDE 02/09/2023 [facility]   Patient Active Problem List   Diagnosis Date Noted   Malignant neoplasm of upper-outer quadrant of left breast in female, estrogen receptor negative (HCC) 02/16/2023   Connective tissue disease (HCC) 02/21/2022   Essential hypertension 02/21/2022   Multiple joint pain 02/21/2022   Osteoarthritis 02/21/2022   Osteopenia 02/21/2022   Rheumatoid arthritis (HCC) 03/28/2019   Pain in finger of right hand 12/14/2016   Paronychia of right thumb 12/14/2016    REFERRING PROVIDER: Dr. Manus Rudd  REFERRING DIAG: Left breast cancer  THERAPY DIAG:  Malignant neoplasm of upper-outer quadrant of left breast in female, estrogen receptor negative (HCC)  Abnormal posture  Rationale for Evaluation and Treatment: Rehabilitation  ONSET DATE: 01/28/2023  SUBJECTIVE:                                                                                                                                                                                           SUBJECTIVE STATEMENT: Patient reports she is here today to be seen by her medical team for her newly diagnosed left breast cancer.   PERTINENT HISTORY:   Patient was diagnosed on 01/28/2023 with left grade 3 invasive ductal carcinoma breast cancer. It measures 8 mm and is located in the upper outer quadrant. It is triple negative with a Ki67 of 80%. She has rheumatoid arthritis which impacts hand function.  PATIENT GOALS:   reduce lymphedema risk and learn post op HEP.   PAIN:  Are you having pain? No  PRECAUTIONS: Active CA; RA  HAND DOMINANCE: right  WEIGHT BEARING RESTRICTIONS: No  FALLS:  Has patient fallen in last 6 months? No  LIVING ENVIRONMENT: Patient lives  with: alone; daughter lives close by Lives in: House/apartment Has following equipment at home: None  OCCUPATION: Retired  LEISURE: She walks "some"  PRIOR LEVEL OF FUNCTION: Independent   OBJECTIVE:  COGNITION: Overall cognitive status: Within functional limits for tasks assessed    POSTURE:  Forward head and rounded shoulders posture  UPPER EXTREMITY AROM/PROM:  A/PROM RIGHT   eval   Shoulder extension 56  Shoulder flexion 144  Shoulder abduction 131  Shoulder internal rotation 70  Shoulder external rotation 84    (Blank rows = not tested)  A/PROM LEFT   eval  Shoulder extension 50  Shoulder flexion 140  Shoulder abduction 145  Shoulder internal rotation 61  Shoulder external rotation 82    (Blank rows = not tested)  CERVICAL AROM: All within normal limits:    Percent limited  Flexion WNL  Extension WNL  Right lateral flexion 25% limited  Left lateral flexion 25% limited  Right rotation 50% limited  Left rotation 50% limited    UPPER EXTREMITY STRENGTH: WNL  LYMPHEDEMA ASSESSMENTS:   LANDMARK RIGHT   eval  10 cm proximal to olecranon process 23.7  Olecranon process 21  10 cm proximal to ulnar styloid process 16.2  Just proximal to ulnar styloid process 13.8  Across hand at thumb web space 18.5  At base of 2nd digit 5.9  (Blank rows = not tested)  LANDMARK LEFT   eval  10 cm proximal to olecranon process 24  Olecranon  process 21.2  10 cm proximal to ulnar styloid process 16.2  Just proximal to ulnar styloid process 13.5  Across hand at thumb web space 17.8  At base of 2nd digit 5.9  (Blank rows = not tested)  L-DEX LYMPHEDEMA SCREENING:  The patient was assessed using the L-Dex machine today to produce a lymphedema index baseline score. The patient will be reassessed on a regular basis (typically every 3 months) to obtain new L-Dex scores. If the score is > 6.5 points away from his/her baseline score indicating onset of subclinical lymphedema, it will be recommended to wear a compression garment for 4 weeks, 12 hours per day and then be reassessed. If the score continues to be > 6.5 points from baseline at reassessment, we will initiate lymphedema treatment. Assessing in this manner has a 95% rate of preventing clinically significant lymphedema.   L-DEX FLOWSHEETS - 02/18/23 1100       L-DEX LYMPHEDEMA SCREENING   Measurement Type Unilateral    L-DEX MEASUREMENT EXTREMITY Upper Extremity    POSITION  Standing    DOMINANT SIDE Right    At Risk Side Left    BASELINE SCORE (UNILATERAL) 5.7             QUICK DASH SURVEY:  Neldon Mc - 02/18/23 0001     Open a tight or new jar Severe difficulty    Do heavy household chores (wash walls, wash floors) Mild difficulty    Carry a shopping bag or briefcase Moderate difficulty    Wash your back Mild difficulty    Use a knife to cut food No difficulty    Recreational activities in which you take some force or impact through your arm, shoulder, or hand (golf, hammering, tennis) Moderate difficulty    During the past week, to what extent has your arm, shoulder or hand problem interfered with your normal social activities with family, friends, neighbors, or groups? Modererately    During the past week, to what extent has your arm,  shoulder or hand problem limited your work or other regular daily activities Slightly    Arm, shoulder, or hand pain. Moderate     Tingling (pins and needles) in your arm, shoulder, or hand None    Difficulty Sleeping No difficulty    DASH Score 31.82 %              PATIENT EDUCATION:  Education details: Lymphedema risk reduction and post op shoulder/posture HEP Person educated: Patient Education method: Explanation, Demonstration, Handout Education comprehension: Patient verbalized understanding and returned demonstration  HOME EXERCISE PROGRAM: Patient was instructed today in a home exercise program today for post op shoulder range of motion. These included active assist shoulder flexion in sitting, scapular retraction, wall walking with shoulder abduction, and hands behind head external rotation.  She was encouraged to do these twice a day, holding 3 seconds and repeating 5 times when permitted by her physician.   ASSESSMENT:  CLINICAL IMPRESSION: Patient was diagnosed on 01/28/2023 with left grade 3 invasive ductal carcinoma breast cancer. It measures 8 mm and is located in the upper outer quadrant. It is triple negative with a Ki67 of 80%. She has rheumatoid arthritis which impacts hand function.Her multidisciplinary medical team met prior to her assessments to determine a recommended treatment plan. She is planning to have a left lumpectomy and sentinel node biopsy followed by chemotherapy and radiation. She will benefit from a post op PT reassessment to determine needs and from L-Dex screens every 3 months for 2 years to detect subclinical lymphedema.  Pt will benefit from skilled therapeutic intervention to improve on the following deficits: Decreased knowledge of precautions, impaired UE functional use, pain, decreased ROM, postural dysfunction.   PT treatment/interventions: ADL/self-care home management, pt/family education, therapeutic exercise  REHAB POTENTIAL: Excellent  CLINICAL DECISION MAKING: Stable/uncomplicated  EVALUATION COMPLEXITY: Low   GOALS: Goals reviewed with patient?  YES  LONG TERM GOALS: (STG=LTG)    Name Target Date Goal status  1 Pt will be able to verbalize understanding of pertinent lymphedema risk reduction practices relevant to her dx specifically related to skin care.  Baseline:  No knowledge 02/18/2023 Achieved at eval  2 Pt will be able to return demo and/or verbalize understanding of the post op HEP related to regaining shoulder ROM. Baseline:  No knowledge 02/18/2023 Achieved at eval  3 Pt will be able to verbalize understanding of the importance of attending the post op After Breast CA Class for further lymphedema risk reduction education and therapeutic exercise.  Baseline:  No knowledge 02/18/2023 Achieved at eval  4 Pt will demo she has regained full shoulder ROM and function post operatively compared to baselines.  Baseline: See objective measurements taken today. 04/15/2023     PLAN:  PT FREQUENCY/DURATION: EVAL and 1 follow up appointment.   PLAN FOR NEXT SESSION: will reassess 3-4 weeks post op to determine needs.   Patient will follow up at outpatient cancer rehab 3-4 weeks following surgery.  If the patient requires physical therapy at that time, a specific plan will be dictated and sent to the referring physician for approval. The patient was educated today on appropriate basic range of motion exercises to begin post operatively and the importance of attending the After Breast Cancer class following surgery.  Patient was educated today on lymphedema risk reduction practices as it pertains to recommendations that will benefit the patient immediately following surgery.  She verbalized good understanding.    Physical Therapy Information for After Breast Cancer Surgery/Treatment:  Lymphedema  is a swelling condition that you may be at risk for in your arm if you have lymph nodes removed from the armpit area.  After a sentinel node biopsy, the risk is approximately 5-9% and is higher after an axillary node dissection.  There is treatment  available for this condition and it is not life-threatening.  Contact your physician or physical therapist with concerns. You may begin the 4 shoulder/posture exercises (see additional sheet) when permitted by your physician (typically a week after surgery).  If you have drains, you may need to wait until those are removed before beginning range of motion exercises.  A general recommendation is to not lift your arms above shoulder height until drains are removed.  These exercises should be done to your tolerance and gently.  This is not a "no pain/no gain" type of recovery so listen to your body and stretch into the range of motion that you can tolerate, stopping if you have pain.  If you are having immediate reconstruction, ask your plastic surgeon about doing exercises as he or she may want you to wait. We encourage you to attend the free one time ABC (After Breast Cancer) class offered by [facility].  You will learn information related to lymphedema risk, prevention and treatment and additional exercises to regain mobility following surgery.  You can call [phone] for more information.  This is offered the 1st and 3rd Monday of each month.  You only attend the class one time. While undergoing any medical procedure or treatment, try to avoid blood pressure being taken or needle sticks from occurring on the arm on the side of cancer.   This recommendation begins after surgery and continues for the rest of your life.  This may help reduce your risk of getting lymphedema (swelling in your arm). An excellent resource for those seeking information on lymphedema is the National Lymphedema Network's web site. It can be accessed at www.lymphnet.org If you notice swelling in your hand, arm or breast at any time following surgery (even if it is many years from now), please contact your doctor or physical therapist to discuss this.  Lymphedema can be treated at any time but it is easier for  you if it is treated early on.  If you feel like your shoulder motion is not returning to normal in a reasonable amount of time, please contact your surgeon or physical therapist.  [facility] [phone]. [address], [place] [address]  ABC CLASS After Breast Cancer Class  After Breast Cancer Class is a specially designed exercise class to assist you in a safe recover after having breast cancer surgery.  In this class you will learn how to get back to full function whether your drains were just removed or if you had surgery a month ago.  This one-time class is held the 1st and 3rd Monday of every month from 11:00 a.m. until 12:00 noon virtually.  This class is FREE and space is limited. For more information or to register for the next available class, call [phone].  Class Goals  Understand specific stretches to improve the flexibility of you chest and shoulder. Learn ways to safely strengthen your upper body and improve your posture. Understand the warning signs of infection and why you may be at risk for an arm infection. Learn about Lymphedema and prevention.  ** You do not attend this class until after surgery.  Drains must be removed  to participate  Patient was instructed today in a home exercise program today for post op shoulder range of motion. These included active assist shoulder flexion in sitting, scapular retraction, wall walking with shoulder abduction, and hands behind head external rotation.  She was encouraged to do these twice a day, holding 3 seconds and repeating 5 times when permitted by her physician.    Guillermina Shaft,MARTI COOPER, PT 02/18/2023, 11:48 AM

## 2023-02-18 NOTE — Progress Notes (Signed)
[facility] Clinical Social Work  Initial Assessment   Heather Roberson is a 72 y.o. year old female accompanied by daughter, Heather Roberson. Clinical Social Work was referred by  [facility]  for assessment of psychosocial needs.   SDOH (Social Determinants of Health) assessments performed: Yes SDOH Interventions    Flowsheet Row Clinical Support from 02/18/2023 in [facility]  SDOH Interventions   Food Insecurity Interventions [id number] Referral, Other (Comment)  [food pantry list]  Transportation Interventions Intervention Not Indicated  Financial Strain Interventions Other (Comment)  [facility]]       SDOH Screenings   Food Insecurity: Food Insecurity Present (02/18/2023)  Transportation Needs: No Transportation Needs (02/18/2023)  Financial Resource Strain: Medium Risk (02/18/2023)  Tobacco Use: Unknown (02/18/2023)     Distress Screen completed: No     No data to display            Family/Social Information:  Housing Arrangement: patient lives alone in her own home. Daughter lives nearby Family members/support persons in your life? Family Transportation concerns: no  Employment: Retired.  Income source: Actor concerns: Yes, current concerns Type of concern: Medical bills and Food Food access concerns: yes, cost issues. Has accessed some food pantries. Previously had snap, but not currently Religious or spiritual practice: Yes-part of a church Services Currently in place:  [employer]  Coping/ Adjustment to diagnosis: Patient understands treatment plan and what happens next? Yes- she is overwhelmed but generally understands plan given today by medical team Concerns about diagnosis and/or treatment: Overwhelmed by information and How I will pay for the services I need Patient reported stressors: Finances, Food, and Anxiety/ nervousness Current coping skills/ strengths: Supportive family/friends  and  Other: able to access resources    SUMMARY: Current SDOH Barriers:  Financial constraints related to fixed income and Limited access to food  Clinical Social Work Clinical Goal(s):  Explore community resource options for unmet needs related to:  Data processing manager , Anxiety  Interventions: Discussed common feeling and emotions when being diagnosed with cancer, and the importance of support during treatment Informed patient of the support team roles and support services at [facility] Provided CSW contact information and encouraged patient to call with any questions or concerns Referred patient to Meals on Wheels Provided information on food pantries & local resources, checking with insurance re: food benefits, [employer] [facility] [place] for potential assistance with medical costs, local therapy options   Follow Up Plan: Patient will work on applying for food stamps and [employer] [facility] [place]. Patient will contact CSW with questions or for assistance submitting PiP application Patient verbalizes understanding of plan: Yes    Dev Dhondt E Dauntae Derusha, LCSW Clinical Social Worker [facility]

## 2023-02-18 NOTE — Progress Notes (Signed)
[facility]cer Center CONSULT NOTE  Patient Care Team: Kirby Funk, MD as PCP - General (Internal Medicine) Rachel Moulds, MD as Consulting Physician (Hematology and Oncology) Antony Blackbird, MD as Consulting Physician (Radiation Oncology) Manus Rudd, MD as Consulting Physician (General Surgery) Donnelly Angelica, RN as Oncology Nurse Navigator Pershing Proud, RN as Oncology Nurse Navigator  CHIEF COMPLAINTS/PURPOSE OF CONSULTATION:  Newly diagnosed breast cancer  HISTORY OF PRESENTING ILLNESS:  Heather Roberson 72 y.o. female is here because of recent diagnosis of left IDC  I reviewed her records extensively and collaborated the history with the patient.  SUMMARY OF ONCOLOGIC HISTORY: Oncology History  Malignant neoplasm of upper-outer quadrant of left breast in female, estrogen receptor negative (HCC)  01/28/2023 Mammogram   Mammogram showed highly suspicious mass in the left breast at 1 0 clock measuring 8 mm. Targeted ultrasound performed in the left breast at 1 o'clock 10 cm from the nipple demonstrating an irregular hypoechoic mass measuring 0.7 x 0.6 x 0.8 cm. This corresponds to the mammographic finding.   Targeted ultrasound the left axilla demonstrates normal lymph nodes.   02/09/2023 Pathology Results   Pathology results showed grade 3 IDC, triple neg, Ki 67 80%   02/16/2023 Initial Diagnosis   Malignant neoplasm of upper-outer quadrant of left breast in female, estrogen receptor negative (HCC)    She has rheumatoid arthritis diagnosed many yrs ago, takes hydroxychloroquine. Other than RA, she has HTN. She is a retired Lawyer. She has 3 children, age at first child birth 45 yrs. No history of HRT. She hasn't breastfed her kids for much long. She had gallbladder and uterus removed for gall stones and fibroids. Sister with BC at 84.  MEDICAL HISTORY:  Past Medical History:  Diagnosis Date   Arthritis     SURGICAL HISTORY: Past Surgical History:   Procedure Laterality Date   BREAST BIOPSY Left 02/09/2023   [place] LT BREAST BX W LOC DEV 1ST LESION IMG BX SPEC US GUIDE 02/09/2023 [facility] MAMMOGRAPHY    SOCIAL HISTORY: Social History   Socioeconomic History   Marital status: Divorced    Spouse name: Not on file   Number of children: Not on file   Years of education: Not on file   Highest education level: Not on file  Occupational History   Not on file  Tobacco Use   Smoking status: Never   Smokeless tobacco: Not on file  Substance and Sexual Activity   Alcohol use: No   Drug use: No   Sexual activity: Not on file  Other Topics Concern   Not on file  Social History Narrative   Not on file   Social Determinants of Health   Financial Resource Strain: Not on file  Food Insecurity: Not on file  Transportation Needs: Not on file  Physical Activity: Not on file  Stress: Not on file  Social Connections: Not on file  Intimate Partner Violence: Not on file    FAMILY HISTORY: Family History  Family history unknown: Yes    ALLERGIES:  is allergic to other.  MEDICATIONS:  Current Outpatient Medications  Medication Sig Dispense Refill   Abatacept (ORENCIA) 125 MG/ML SOSY 1 ml     amLODipine (NORVASC) 2.5 MG tablet amlodipine 2.5 mg tablet     Ascorbic Acid (VITAMIN C PO) Take 1 tablet by mouth daily.     ascorbic acid (VITAMIN C) 500 MG tablet Take by mouth.     benzonatate (TESSALON) 100 MG capsule 1 capsule  as needed     brompheniramine-pseudoephedrine-DM 30-2-10 MG/5ML syrup Take by mouth.     Calcium Carb-Cholecalciferol (CALCIUM 600+D) 600-10 MG-MCG TABS 1 tablet with food     CALCIUM PO Take 1 tablet by mouth daily.     carbamide peroxide (DEBROX) 6.5 % OTIC solution Place 5 drops into both ears 2 (two) times daily. 15 mL 0   Carboxymethylcellul-Glycerin (CLEAR EYES FOR DRY EYES OP) Place 1-2 drops into both eyes 2 (two) times daily as needed (for dryness).     cholecalciferol (VITAMIN D) 1000 units tablet Take 1,000  Units by mouth daily.     Cyanocobalamin (B-12 PO) Take 1 tablet by mouth daily.     ferrous sulfate 325 (65 FE) MG tablet Take 325 mg by mouth daily with breakfast.     fluticasone (FLONASE ALLERGY RELIEF) 50 MCG/ACT nasal spray 2 sprays in each nostril     fluticasone (FLONASE) 50 MCG/ACT nasal spray Place 1-2 sprays into both nostrils daily as needed for allergies or rhinitis.     HUMIRA PEN 40 MG/0.8ML PNKT One injection every other week  2   hydroxychloroquine (PLAQUENIL) 200 MG tablet Take 2 tablets by mouth every morning.  0   hydroxychloroquine (PLAQUENIL) 200 MG tablet Plaquenil 200 mg tablet  Take 1 tablet every day by oral route.     leflunomide (ARAVA) 10 MG tablet Take 1 tablet by mouth daily.     levocetirizine (XYZAL) 5 MG tablet Take by mouth.     Multiple Vitamins-Minerals (ONE-A-DAY WOMENS 50 PLUS PO) Take 1 tablet by mouth daily after breakfast.     No current facility-administered medications for this visit.    PHYSICAL EXAMINATION: ECOG PERFORMANCE STATUS: 0 - Asymptomatic  Vitals:   02/18/23 0845  BP: (!) 164/97  Pulse: (!) 106  Resp: 18  Temp: (!) 97.5 F (36.4 C)  SpO2: 100%   Filed Weights   02/18/23 0845  Weight: 114 lb 3.2 oz (51.8 kg)    GENERAL:alert, no distress and comfortable Breast: bilateral breasts inspected. Small breasts. Palpable breast mass in upper outer quadrant about 1 cm. Also post biopsy changes noted. No other regional adenopathy Rheumatoid arthritis hands CTA bilaterally No LE edema.  LABORATORY DATA:  I have reviewed the data as listed Lab Results  Component Value Date   WBC 4.5 02/18/2023   HGB 12.4 02/18/2023   HCT 37.2 02/18/2023   MCV 98.7 02/18/2023   PLT 192 02/18/2023   Lab Results  Component Value Date   NA 141 02/18/2023   K 3.8 02/18/2023   CL 105 02/18/2023   CO2 29 02/18/2023    RADIOGRAPHIC STUDIES: I have personally reviewed the radiological reports and agreed with the findings in the  report.  ASSESSMENT AND PLAN:  Malignant neoplasm of upper-outer quadrant of left breast in female, estrogen receptor negative (HCC) This is a very pleasant 72 yr old female patient with past medical history significant for hypertension, rheumatoid arthritis on hydroxychloroquine now diagnosed with left breast invasive ductal carcinoma, high-grade, triple negative referred to breast MDC for additional recommendations.  She arrived to the appointment today with her daughter.  At baseline she has some anxiety, some pain from the arthritis but she is very functional, lives independently and can take care of all her activities of daily living.  She palpated this mass in her left breast about 2 to 3 weeks ago.  She had an abnormal mammogram around the age of 85 for which she needed biopsy  of the right breast but this was benign.  There is some family history of breast cancer in sister diagnosed at the age of 59.  No other family history of cancer.  Physical examination today with palpable breast lump in the upper outer quadrant of the left breast measuring just about a centimeter, no other regional adenopathy.  Postbiopsy changes noted in the left breast.  Given triple negative biology, we have discussed about adjuvant chemotherapy.  I have presented her with the option of lumpectomy followed by adjuvant docetaxel and cyclophosphamide every 21 days for 4 cycles followed by adjuvant radiation and observation with serial mammograms.  I have discussed adverse effects of chemotherapy including but not limited to fatigue, nausea, vomiting, diarrhea, increased risk of infections, neuropathy, alopecia etc.  She understands that the neuropathy can sometimes be permanent.  She is willing to consider all the options for treatment.  She will return to clinic after surgery to initiate chemotherapy.  We have discussed about port for chemotherapy administration, IV access and she is agreeable to port placement as well.  Thank  you for consulting [place] in the care of this patient.  Please do not hesitate to contact us with any additional questions or concerns.  Total time spent: 60 min All questions were answered. The patient knows to call the clinic with any problems, questions or concerns.    Rachel Moulds, MD 02/18/23

## 2023-02-19 ENCOUNTER — Encounter: Payer: Self-pay | Admitting: Podiatry

## 2023-02-19 ENCOUNTER — Ambulatory Visit (INDEPENDENT_AMBULATORY_CARE_PROVIDER_SITE_OTHER): Payer: Medicare PPO | Admitting: Podiatry

## 2023-02-19 DIAGNOSIS — B351 Tinea unguium: Secondary | ICD-10-CM | POA: Diagnosis not present

## 2023-02-19 DIAGNOSIS — M79675 Pain in left toe(s): Secondary | ICD-10-CM

## 2023-02-19 DIAGNOSIS — M21621 Bunionette of right foot: Secondary | ICD-10-CM | POA: Diagnosis not present

## 2023-02-19 DIAGNOSIS — M79674 Pain in right toe(s): Secondary | ICD-10-CM

## 2023-02-19 NOTE — Progress Notes (Signed)
Subjective:   Patient ID: Heather Roberson, female   DOB: 72 y.o.   MRN: [id number]   HPI Patient presents with pain underneath the right fifth metatarsal head and nail disease with thick yellow brittle nails 1-5 both feet with the hallux nails of found to be especially thickened and it discomforting when pressed   ROS      Objective:  Physical Exam  Neurovascular status was found to be intact with prominence of the fifth metatarsal head right with plantar lesion and moderate inflammation with severely thickened nailbeds 1-5 both feet especially the hallux with pain     Assessment:  Tailor's bunion deformity right with structural change to the metatarsal head along with mycotic nail infection 1-5 both feet     Plan:  H&P reviewed both conditions and I do think ultimately the hallux nails will need to be removed but at this point we are getting continue conservative and I debrided nailbeds 1-5 both feet no angiogenic bleeding discussed the fifth metatarsal head discussed possibility for bad head resection educating her on this and debridement accomplished currently she return to rigid as needed

## 2023-02-23 ENCOUNTER — Telehealth: Payer: Self-pay | Admitting: Radiology

## 2023-02-23 ENCOUNTER — Other Ambulatory Visit: Payer: Self-pay | Admitting: Surgery

## 2023-02-23 DIAGNOSIS — Z171 Estrogen receptor negative status [ER-]: Secondary | ICD-10-CM

## 2023-02-23 NOTE — Pre-Procedure Instructions (Signed)
Surgical Instructions    Your procedure is scheduled on Mar 03, 2023.  Report to [facility] Main Entrance "A" at 5:30 A.M., then check in with the Admitting office.  Call this number if you have problems the morning of surgery:  [phone]  If you have any questions prior to your surgery date call [phone]: Open Monday-Friday 8am-4pm If you experience any cold or flu symptoms such as cough, fever, chills, shortness of breath, etc. between now and your scheduled surgery, please notify us at the above number.     Remember:  Do not eat after midnight the night before your surgery  You may drink clear liquids until 4:30 AM the morning of your surgery.   Clear liquids allowed are: Water, Non-Citrus Juices (without pulp), Carbonated Beverages, Clear Tea, Black Coffee Only (NO MILK, CREAM OR POWDERED CREAMER of any kind), and Gatorade.     Take these medicines the morning of surgery with A SIP OF WATER:  amLODipine (NORVASC)   carbamide peroxide (DEBROX) ear drops  fluticasone (FLONASE ALLERGY RELIEF) nasal spray   hydroxychloroquine (PLAQUENIL)   benzonatate (TESSALON) - may take if needed  Carboxymethylcellul-Glycerin (CLEAR EYES FOR DRY EYES OP) - may take if needed   STOP taking leflunomide (ARAVA), HUMIRA PEN, and Abatacept (ORENCIA) three days prior to surgery. Your last dose will be May 10th.  As of today, STOP taking any Aspirin (unless otherwise instructed by your surgeon) Aleve, Naproxen, Ibuprofen, Motrin, Advil, Goody's, BC's, all herbal medications, fish oil, and all vitamins.                     Do NOT Smoke (Tobacco/Vaping) for 24 hours prior to your procedure.  If you use a CPAP at night, you may bring your mask/headgear for your overnight stay.   Contacts, glasses, piercing's, hearing aid's, dentures or partials may not be worn into surgery, please bring cases for these belongings.    For patients admitted to the hospital, discharge time will be determined by  your treatment team.   Patients discharged the day of surgery will not be allowed to drive home, and someone needs to stay with them for 24 hours.  SURGICAL WAITING ROOM VISITATION Patients having surgery or a procedure may have no more than 2 support people in the waiting area - these visitors may rotate.   Children under the age of 48 must have an adult with them who is not the patient. If the patient needs to stay at the hospital during part of their recovery, the visitor guidelines for inpatient rooms apply. Pre-op nurse will coordinate an appropriate time for 1 support person to accompany patient in pre-op.  This support person may not rotate.   Please refer to the [facility] website for the visitor guidelines for Inpatients (after your surgery is over and you are in a regular room).    Special instructions:   [place]- Preparing For Surgery  Before surgery, you can play an important role. Because skin is not sterile, your skin needs to be as free of germs as possible. You can reduce the number of germs on your skin by washing with CHG (chlorahexidine gluconate) Soap before surgery.  CHG is an antiseptic cleaner which kills germs and bonds with the skin to continue killing germs even after washing.    Oral Hygiene is also important to reduce your risk of infection.  Remember - BRUSH YOUR TEETH THE MORNING OF SURGERY WITH YOUR REGULAR TOOTHPASTE  Please do  not use if you have an allergy to CHG or antibacterial soaps. If your skin becomes reddened/irritated stop using the CHG.  Do not shave (including legs and underarms) for at least 48 hours prior to first CHG shower. It is OK to shave your face.  Please follow these instructions carefully.   Shower the NIGHT BEFORE SURGERY and the MORNING OF SURGERY  If you chose to wash your hair, wash your hair first as usual with your normal shampoo.  After you shampoo, rinse your hair and body thoroughly to remove the shampoo.  Use CHG Soap  as you would any other liquid soap. You can apply CHG directly to the skin and wash gently with a scrungie or a clean washcloth.   Apply the CHG Soap to your body ONLY FROM THE NECK DOWN.  Do not use on open wounds or open sores. Avoid contact with your eyes, ears, mouth and genitals (private parts). Wash Face and genitals (private parts)  with your normal soap.   Wash thoroughly, paying special attention to the area where your surgery will be performed.  Thoroughly rinse your body with warm water from the neck down.  DO NOT shower/wash with your normal soap after using and rinsing off the CHG Soap.  Pat yourself dry with a CLEAN TOWEL.  Wear CLEAN PAJAMAS to bed the night before surgery  Place CLEAN SHEETS on your bed the night before your surgery  DO NOT SLEEP WITH PETS.   Day of Surgery: Take a shower with CHG soap. Do not wear jewelry or makeup Do not wear lotions, powders, perfumes/colognes, or deodorant. Do not shave 48 hours prior to surgery.  Men may shave face and neck. Do not bring valuables to the hospital.  [facility] is not responsible for any belongings or valuables. Do not wear nail polish, gel polish, artificial nails, or any other type of covering on natural nails (fingers and toes) If you have artificial nails or gel coating that need to be removed by a nail salon, please have this removed prior to surgery. Artificial nails or gel coating may interfere with anesthesia's ability to adequately monitor your vital signs.  Wear Clean/Comfortable clothing the morning of surgery Remember to brush your teeth WITH YOUR REGULAR TOOTHPASTE.   Please read over the following fact sheets that you were given.    If you received a COVID test during your pre-op visit  it is requested that you wear a mask when out in public, stay away from anyone that may not be feeling well and notify your surgeon if you develop symptoms. If you have been in contact with anyone that has tested  positive in the last 10 days please notify you surgeon.

## 2023-02-23 NOTE — Telephone Encounter (Signed)
[employer] 2021-05 - Specimen Collection Study to Evaluate Biomarkers in Subjects with Cancer    02/23/23  PHONE CALL: Confirmed I was speaking with Heather Roberson . Informed patient reason for call is to follow-up on potential interest or questions/concerns for the above mentioned study. Patient stated she has not had time to review consent documents. This coordinator expressed understanding. Patient is okay with a follow-up call later this week.   Merri Brunette, RT(R)(T) Clinical Research Coordinator

## 2023-02-24 ENCOUNTER — Encounter (HOSPITAL_COMMUNITY)
Admission: RE | Admit: 2023-02-24 | Discharge: 2023-02-24 | Disposition: A | Discharge status: Home / Self Care | Payer: Medicare PPO | Source: Ambulatory Visit | Attending: Surgery | Admitting: Surgery

## 2023-02-24 ENCOUNTER — Encounter: Payer: Self-pay | Admitting: *Deleted

## 2023-02-24 ENCOUNTER — Telehealth: Payer: Self-pay | Admitting: *Deleted

## 2023-02-24 ENCOUNTER — Encounter (HOSPITAL_COMMUNITY): Payer: Self-pay

## 2023-02-24 ENCOUNTER — Other Ambulatory Visit: Payer: Self-pay

## 2023-02-24 VITALS — BP 117/101 | HR 116 | Temp 98.2°F | Resp 17 | Ht 63.0 in | Wt 112.7 lb

## 2023-02-24 DIAGNOSIS — Z01818 Encounter for other preprocedural examination: Secondary | ICD-10-CM

## 2023-02-24 HISTORY — DX: Essential (primary) hypertension: I10

## 2023-02-24 NOTE — Telephone Encounter (Signed)
Spoke to pt concerning [facility] from 5.1.24. Denies questions or concerns regarding dx or treatment care plan. Encourage pt to call with needs. Received verbal understanding. 

## 2023-02-24 NOTE — Progress Notes (Signed)
PCP - [employer] - Denies  PPM/ICD - Denies  Chest x-ray - 09/04/2022 EKG - Today at PAT, 02/24/2023 Stress Test -  ECHO -  Cardiac Cath -   Sleep Study - Denies CPAP - Denies  Non-diabetic  Blood Thinner Instructions: Denies Aspirin Instructions: Denies  ERAS Protcol -Yes, clear liquids until 0430 PRE-SURGERY Ensure or G2-   COVID TEST- n/a  Anesthesia review: Yes. Seed placement, HTN  Patient denies shortness of breath, fever, cough and chest pain at PAT appointment   All instructions explained to the patient, with a verbal understanding of the material. Patient agrees to go over the instructions while at home for a better understanding. Patient also instructed to self quarantine after being tested for COVID-19. The opportunity to ask questions was provided.

## 2023-02-25 ENCOUNTER — Telehealth: Payer: Self-pay

## 2023-02-25 ENCOUNTER — Telehealth: Payer: Self-pay | Admitting: Radiology

## 2023-02-25 ENCOUNTER — Telehealth: Payer: Self-pay | Admitting: Hematology and Oncology

## 2023-02-25 NOTE — Telephone Encounter (Signed)
[employer] 2021-05 - Specimen Collection Study to Evaluate Biomarkers in Subjects with Cancer    02/25/23  PHONE CALL: Patient called this coordinator to discuss the above mentioned study. This coordinator explained an overview of the study including how study is voluntary. Patient expressed concerns over her most recent blood collection and requested results from her most recent blood collection. This coordinator reached out to Dr. Remonia Richter nurse requesting her to call patient to discuss. Patient stated she would return my call once she discusses these results. This coordinator stressed to the patient that this study is completely separate from any current blood collections and no results will be released to [place] or the patient. The patient expressed understanding. This coordinator informed patient the blood would need to be collected prior to her surgery next Tuesday. The patient expressed understanding and stated she would call back before her surgery to schedule a time for our visit. This coordinator expressed understanding and thanked patient for her time and consideration of the above mentioned study.    Merri Brunette, RT(R)(T) Clinical Research Coordinator

## 2023-02-25 NOTE — Telephone Encounter (Signed)
Spoke with patient confirming upcoming appointment  

## 2023-02-25 NOTE — Telephone Encounter (Signed)
Attempted to call pt to discuss lab results as requested by research. LVM for call back.

## 2023-02-26 ENCOUNTER — Telehealth: Payer: Self-pay

## 2023-02-26 ENCOUNTER — Other Ambulatory Visit: Payer: Self-pay

## 2023-02-26 DIAGNOSIS — Z171 Estrogen receptor negative status [ER-]: Secondary | ICD-10-CM

## 2023-02-26 NOTE — Telephone Encounter (Signed)
[employer] 2021-05 - Specimen Collection Study to Evaluate Biomarkers in Subjects with Cancer   Rec'd a message from Val that patient would like to come tomorrow for study consent and labs. Called patient at her sister's house. She verbalized that she will read the consent and be prepared to sign tomorrow at 1000. Reinforced that she will have research labs (not surgery/preop labs) drawn following that.  Margret Chance Makai Agostinelli, RN, BSN, [facility]  [facility] Clinical Research Nurse [facility] Direct Dial [phone]  Pager [phone] 02/26/2023 3:56 PM

## 2023-02-27 ENCOUNTER — Inpatient Hospital Stay: Payer: Medicare PPO | Admitting: Radiology

## 2023-02-27 ENCOUNTER — Inpatient Hospital Stay: Payer: Medicare PPO

## 2023-03-02 ENCOUNTER — Ambulatory Visit
Admission: RE | Admit: 2023-03-02 | Discharge: 2023-03-02 | Disposition: A | Discharge status: Home / Self Care | Payer: Medicare PPO | Source: Ambulatory Visit | Attending: Surgery | Admitting: Surgery

## 2023-03-02 ENCOUNTER — Other Ambulatory Visit: Payer: Self-pay | Admitting: Surgery

## 2023-03-02 DIAGNOSIS — Z171 Estrogen receptor negative status [ER-]: Secondary | ICD-10-CM

## 2023-03-02 HISTORY — PX: BREAST BIOPSY: SHX20

## 2023-03-03 ENCOUNTER — Ambulatory Visit (HOSPITAL_COMMUNITY)
Admission: RE | Admit: 2023-03-03 | Discharge: 2023-03-03 | Disposition: A | Discharge status: Home / Self Care | Payer: Medicare PPO | Source: Ambulatory Visit | Attending: Surgery | Admitting: Surgery

## 2023-03-03 ENCOUNTER — Encounter (HOSPITAL_COMMUNITY)
Admission: RE | Disposition: A | Discharge status: Home / Self Care | Payer: Self-pay | Source: Home / Self Care | Attending: Surgery

## 2023-03-03 ENCOUNTER — Encounter (HOSPITAL_COMMUNITY): Payer: Self-pay | Admitting: Surgery

## 2023-03-03 ENCOUNTER — Ambulatory Visit (HOSPITAL_COMMUNITY): Payer: Medicare PPO

## 2023-03-03 ENCOUNTER — Other Ambulatory Visit: Payer: Self-pay

## 2023-03-03 ENCOUNTER — Ambulatory Visit (HOSPITAL_COMMUNITY): Payer: Medicare PPO | Admitting: Physician Assistant

## 2023-03-03 ENCOUNTER — Ambulatory Visit (HOSPITAL_COMMUNITY)
Admission: RE | Admit: 2023-03-03 | Discharge: 2023-03-03 | Disposition: A | Discharge status: Home / Self Care | Payer: Medicare PPO | Attending: Surgery | Admitting: Surgery

## 2023-03-03 ENCOUNTER — Ambulatory Visit (HOSPITAL_BASED_OUTPATIENT_CLINIC_OR_DEPARTMENT_OTHER): Payer: Medicare PPO | Admitting: Anesthesiology

## 2023-03-03 ENCOUNTER — Ambulatory Visit
Admission: RE | Admit: 2023-03-03 | Discharge: 2023-03-03 | Disposition: A | Discharge status: Home / Self Care | Payer: Medicare PPO | Source: Ambulatory Visit | Attending: Surgery | Admitting: Surgery

## 2023-03-03 DIAGNOSIS — Z171 Estrogen receptor negative status [ER-]: Secondary | ICD-10-CM | POA: Insufficient documentation

## 2023-03-03 DIAGNOSIS — C50912 Malignant neoplasm of unspecified site of left female breast: Secondary | ICD-10-CM

## 2023-03-03 DIAGNOSIS — Z79899 Other long term (current) drug therapy: Secondary | ICD-10-CM | POA: Insufficient documentation

## 2023-03-03 DIAGNOSIS — Z7969 Long term (current) use of other immunomodulators and immunosuppressants: Secondary | ICD-10-CM | POA: Diagnosis not present

## 2023-03-03 DIAGNOSIS — I10 Essential (primary) hypertension: Secondary | ICD-10-CM | POA: Diagnosis not present

## 2023-03-03 DIAGNOSIS — M8580 Other specified disorders of bone density and structure, unspecified site: Secondary | ICD-10-CM | POA: Insufficient documentation

## 2023-03-03 DIAGNOSIS — M069 Rheumatoid arthritis, unspecified: Secondary | ICD-10-CM | POA: Insufficient documentation

## 2023-03-03 DIAGNOSIS — M1990 Unspecified osteoarthritis, unspecified site: Secondary | ICD-10-CM | POA: Insufficient documentation

## 2023-03-03 DIAGNOSIS — D0512 Intraductal carcinoma in situ of left breast: Secondary | ICD-10-CM | POA: Insufficient documentation

## 2023-03-03 HISTORY — PX: BREAST LUMPECTOMY WITH RADIOACTIVE SEED AND SENTINEL LYMPH NODE BIOPSY: SHX6550

## 2023-03-03 HISTORY — PX: PORTACATH PLACEMENT: SHX2246

## 2023-03-03 SURGERY — BREAST LUMPECTOMY WITH RADIOACTIVE SEED AND SENTINEL LYMPH NODE BIOPSY
Anesthesia: Regional | Site: Chest | Laterality: Right

## 2023-03-03 MED ORDER — ROPIVACAINE HCL 5 MG/ML IJ SOLN
INTRAMUSCULAR | Status: DC | PRN
  Administered 2023-03-03: 30 mL

## 2023-03-03 MED ORDER — TECHNETIUM TC 99M TILMANOCEPT KIT
1.0000 | PACK | Freq: Once | INTRAVENOUS | Status: AC | PRN
  Administered 2023-03-03: 1 via INTRADERMAL

## 2023-03-03 MED ORDER — LIDOCAINE 2% (20 MG/ML) 5 ML SYRINGE
INTRAMUSCULAR | Status: DC | PRN
  Administered 2023-03-03: 40 mg via INTRAVENOUS

## 2023-03-03 MED ORDER — BUPIVACAINE-EPINEPHRINE 0.25% -1:200000 IJ SOLN
INTRAMUSCULAR | Status: DC | PRN
  Administered 2023-03-03: 15 mL

## 2023-03-03 MED ORDER — ACETAMINOPHEN 500 MG PO TABS
1000.0000 mg | ORAL_TABLET | ORAL | Status: AC
  Administered 2023-03-03: 1000 mg via ORAL
  Filled 2023-03-03: qty 2

## 2023-03-03 MED ORDER — PHENYLEPHRINE 80 MCG/ML (10ML) SYRINGE FOR IV PUSH (FOR BLOOD PRESSURE SUPPORT)
PREFILLED_SYRINGE | INTRAVENOUS | Status: DC | PRN
  Administered 2023-03-03 (×3): 160 ug via INTRAVENOUS

## 2023-03-03 MED ORDER — DEXAMETHASONE SODIUM PHOSPHATE 10 MG/ML IJ SOLN
INTRAMUSCULAR | Status: DC | PRN
  Administered 2023-03-03: 10 mg

## 2023-03-03 MED ORDER — FENTANYL CITRATE (PF) 250 MCG/5ML IJ SOLN
INTRAMUSCULAR | Status: DC | PRN
  Administered 2023-03-03: 50 ug via INTRAVENOUS

## 2023-03-03 MED ORDER — TRAMADOL HCL 50 MG PO TABS: 50.0000 mg | ORAL_TABLET | Freq: Four times a day (QID) | ORAL | 0 refills | Status: AC | PRN

## 2023-03-03 MED ORDER — BUPIVACAINE HCL (PF) 0.25 % IJ SOLN
INTRAMUSCULAR | Status: AC
  Filled 2023-03-03: qty 30

## 2023-03-03 MED ORDER — DEXAMETHASONE SODIUM PHOSPHATE 10 MG/ML IJ SOLN
INTRAMUSCULAR | Status: DC | PRN
  Administered 2023-03-03: 10 mg via INTRAVENOUS

## 2023-03-03 MED ORDER — PHENYLEPHRINE HCL-NACL 20-0.9 MG/250ML-% IV SOLN
INTRAVENOUS | Status: DC | PRN
  Administered 2023-03-03: 25 ug/min via INTRAVENOUS

## 2023-03-03 MED ORDER — LACTATED RINGERS IV SOLN: INTRAVENOUS | Status: DC

## 2023-03-03 MED ORDER — ONDANSETRON HCL 4 MG/2ML IJ SOLN
INTRAMUSCULAR | Status: AC
  Filled 2023-03-03: qty 2

## 2023-03-03 MED ORDER — FENTANYL CITRATE (PF) 250 MCG/5ML IJ SOLN
INTRAMUSCULAR | Status: AC
  Filled 2023-03-03: qty 5

## 2023-03-03 MED ORDER — PHENYLEPHRINE 80 MCG/ML (10ML) SYRINGE FOR IV PUSH (FOR BLOOD PRESSURE SUPPORT)
PREFILLED_SYRINGE | INTRAVENOUS | Status: AC
  Filled 2023-03-03: qty 10

## 2023-03-03 MED ORDER — DEXAMETHASONE SODIUM PHOSPHATE 10 MG/ML IJ SOLN
INTRAMUSCULAR | Status: AC
  Filled 2023-03-03: qty 1

## 2023-03-03 MED ORDER — HEPARIN 6000 UNIT IRRIGATION SOLUTION
Status: DC | PRN
  Administered 2023-03-03: 1

## 2023-03-03 MED ORDER — HEPARIN SOD (PORK) LOCK FLUSH 100 UNIT/ML IV SOLN
INTRAVENOUS | Status: AC
  Filled 2023-03-03: qty 5

## 2023-03-03 MED ORDER — PROPOFOL 10 MG/ML IV BOLUS
INTRAVENOUS | Status: DC | PRN
  Administered 2023-03-03: 100 mg via INTRAVENOUS

## 2023-03-03 MED ORDER — FENTANYL CITRATE (PF) 100 MCG/2ML IJ SOLN: 25.0000 ug | INTRAMUSCULAR | Status: DC | PRN

## 2023-03-03 MED ORDER — CHLORHEXIDINE GLUCONATE CLOTH 2 % EX PADS: 6.0000 | MEDICATED_PAD | Freq: Once | CUTANEOUS | Status: DC

## 2023-03-03 MED ORDER — CHLORHEXIDINE GLUCONATE 0.12 % MT SOLN
15.0000 mL | Freq: Once | OROMUCOSAL | Status: AC
  Administered 2023-03-03: 15 mL via OROMUCOSAL
  Filled 2023-03-03: qty 15

## 2023-03-03 MED ORDER — ORAL CARE MOUTH RINSE: 15.0000 mL | Freq: Once | OROMUCOSAL | Status: AC

## 2023-03-03 MED ORDER — CEFAZOLIN SODIUM-DEXTROSE 2-4 GM/100ML-% IV SOLN
2.0000 g | INTRAVENOUS | Status: AC
  Administered 2023-03-03: 2 g via INTRAVENOUS
  Filled 2023-03-03: qty 100

## 2023-03-03 MED ORDER — PROPOFOL 10 MG/ML IV BOLUS
INTRAVENOUS | Status: AC
  Filled 2023-03-03: qty 20

## 2023-03-03 MED ORDER — ONDANSETRON HCL 4 MG/2ML IJ SOLN
INTRAMUSCULAR | Status: DC | PRN
  Administered 2023-03-03: 4 mg via INTRAVENOUS

## 2023-03-03 MED ORDER — HEPARIN 6000 UNIT IRRIGATION SOLUTION
Status: AC
  Filled 2023-03-03: qty 500

## 2023-03-03 MED ORDER — LIDOCAINE 2% (20 MG/ML) 5 ML SYRINGE
INTRAMUSCULAR | Status: AC
  Filled 2023-03-03: qty 5

## 2023-03-03 MED ORDER — HEPARIN SOD (PORK) LOCK FLUSH 100 UNIT/ML IV SOLN
INTRAVENOUS | Status: DC | PRN
  Administered 2023-03-03: 500 [IU]

## 2023-03-03 MED ORDER — 0.9 % SODIUM CHLORIDE (POUR BTL) OPTIME
TOPICAL | Status: DC | PRN
  Administered 2023-03-03: 1000 mL

## 2023-03-03 SURGICAL SUPPLY — 66 items
APL PRP STRL LF DISP 70% ISPRP (MISCELLANEOUS) ×2
APL PRP STRL LF ISPRP CHG 10.5 (MISCELLANEOUS) ×2
APL SKNCLS STERI-STRIP NONHPOA (GAUZE/BANDAGES/DRESSINGS) ×2
APPLICATOR CHLORAPREP 10.5 ORG (MISCELLANEOUS) ×2 IMPLANT
APPLIER CLIP 9.375 MED OPEN (MISCELLANEOUS) ×2
APR CLP MED 9.3 20 MLT OPN (MISCELLANEOUS) ×2
BAG COUNTER SPONGE SURGICOUNT (BAG) ×2 IMPLANT
BAG DECANTER FOR FLEXI CONT (MISCELLANEOUS) ×2 IMPLANT
BAG SPNG CNTER NS LX DISP (BAG) ×2
BENZOIN TINCTURE PRP APPL 2/3 (GAUZE/BANDAGES/DRESSINGS) ×2 IMPLANT
BINDER BREAST LRG (GAUZE/BANDAGES/DRESSINGS) IMPLANT
CANISTER SUCT 3000ML PPV (MISCELLANEOUS) ×2 IMPLANT
CHLORAPREP W/TINT 26 (MISCELLANEOUS) ×2 IMPLANT
CLIP APPLIE 9.375 MED OPEN (MISCELLANEOUS) ×2 IMPLANT
CNTNR URN SCR LID CUP LEK RST (MISCELLANEOUS) ×2 IMPLANT
CONT SPEC 4OZ STRL OR WHT (MISCELLANEOUS) ×2
COVER PROBE W GEL 5X96 (DRAPES) ×2 IMPLANT
COVER SURGICAL LIGHT HANDLE (MISCELLANEOUS) ×2 IMPLANT
COVER TRANSDUCER ULTRASND GEL (DISPOSABLE) IMPLANT
DEVICE DUBIN SPECIMEN MAMMOGRA (MISCELLANEOUS) ×2 IMPLANT
DRAPE C-ARM 42X120 X-RAY (DRAPES) ×2 IMPLANT
DRAPE CHEST BREAST 15X10 FENES (DRAPES) ×2 IMPLANT
DRAPE LAPAROSCOPIC ABDOMINAL (DRAPES) ×2 IMPLANT
DRSG IV TEGADERM 3.5X4.5 STRL (GAUZE/BANDAGES/DRESSINGS) IMPLANT
DRSG TEGADERM 2-3/8X2-3/4 SM (GAUZE/BANDAGES/DRESSINGS) IMPLANT
DRSG TEGADERM 4X4.75 (GAUZE/BANDAGES/DRESSINGS) ×4 IMPLANT
ELECT CAUTERY BLADE 6.4 (BLADE) ×2 IMPLANT
ELECT REM PT RETURN 9FT ADLT (ELECTROSURGICAL) ×2
ELECTRODE REM PT RTRN 9FT ADLT (ELECTROSURGICAL) ×2 IMPLANT
GAUZE 4X4 16PLY ~~LOC~~+RFID DBL (SPONGE) ×2 IMPLANT
GAUZE SPONGE 2X2 8PLY STRL LF (GAUZE/BANDAGES/DRESSINGS) ×2 IMPLANT
GEL ULTRASOUND 20GR AQUASONIC (MISCELLANEOUS) IMPLANT
GLOVE BIO SURGEON STRL SZ7 (GLOVE) ×2 IMPLANT
GLOVE BIOGEL PI IND STRL 7.5 (GLOVE) ×2 IMPLANT
GOWN STRL REUS W/ TWL LRG LVL3 (GOWN DISPOSABLE) ×4 IMPLANT
GOWN STRL REUS W/TWL LRG LVL3 (GOWN DISPOSABLE) ×4
KIT BASIN OR (CUSTOM PROCEDURE TRAY) ×2 IMPLANT
KIT MARKER MARGIN INK (KITS) IMPLANT
KIT PORT POWER 8FR ISP CVUE (Port) IMPLANT
KIT TURNOVER KIT B (KITS) ×2 IMPLANT
LIGHT WAVEGUIDE WIDE FLAT (MISCELLANEOUS) IMPLANT
NDL 18GX1X1/2 (RX/OR ONLY) (NEEDLE) ×2 IMPLANT
NDL FILTER BLUNT 18X1 1/2 (NEEDLE) ×2 IMPLANT
NDL HYPO 25GX1X1/2 BEV (NEEDLE) ×4 IMPLANT
NEEDLE 18GX1X1/2 (RX/OR ONLY) (NEEDLE) ×2 IMPLANT
NEEDLE FILTER BLUNT 18X1 1/2 (NEEDLE) ×2 IMPLANT
NEEDLE HYPO 25GX1X1/2 BEV (NEEDLE) ×4 IMPLANT
NS IRRIG 1000ML POUR BTL (IV SOLUTION) ×2 IMPLANT
PACK GENERAL/GYN (CUSTOM PROCEDURE TRAY) ×2 IMPLANT
PAD ARMBOARD 7.5X6 YLW CONV (MISCELLANEOUS) ×2 IMPLANT
PENCIL BUTTON HOLSTER BLD 10FT (ELECTRODE) ×2 IMPLANT
POSITIONER HEAD DONUT 9IN (MISCELLANEOUS) ×2 IMPLANT
SET INTRODUCER 12FR PACEMAKER (INTRODUCER) IMPLANT
SET SHEATH INTRODUCER 10FR (MISCELLANEOUS) IMPLANT
SHEATH COOK PEEL AWAY SET 9F (SHEATH) IMPLANT
SPONGE T-LAP 4X18 ~~LOC~~+RFID (SPONGE) ×2 IMPLANT
STRIP CLOSURE SKIN 1/2X4 (GAUZE/BANDAGES/DRESSINGS) ×2 IMPLANT
SUT MNCRL AB 4-0 PS2 18 (SUTURE) ×4 IMPLANT
SUT PROLENE 2 0 SH DA (SUTURE) ×2 IMPLANT
SUT VIC AB 3-0 SH 27 (SUTURE) ×4
SUT VIC AB 3-0 SH 27X BRD (SUTURE) ×4 IMPLANT
SYR 5ML LUER SLIP (SYRINGE) ×2 IMPLANT
SYR CONTROL 10ML LL (SYRINGE) ×4 IMPLANT
TOWEL GREEN STERILE (TOWEL DISPOSABLE) ×2 IMPLANT
TOWEL GREEN STERILE FF (TOWEL DISPOSABLE) ×2 IMPLANT
TRAY LAPAROSCOPIC MC (CUSTOM PROCEDURE TRAY) ×2 IMPLANT

## 2023-03-03 NOTE — Discharge Instructions (Signed)
[facility] [employer] [facility] Office Phone Number [phone]  BREAST BIOPSY/ PARTIAL MASTECTOMY: POST OP INSTRUCTIONS  Always review your discharge instruction sheet given to you by the facility where your surgery was performed.  IF YOU HAVE DISABILITY OR FAMILY LEAVE FORMS, YOU MUST BRING THEM TO THE OFFICE FOR PROCESSING.  DO NOT GIVE THEM TO YOUR DOCTOR.  A prescription for pain medication may be given to you upon discharge.  Take your pain medication as prescribed, if needed.  If narcotic pain medicine is not needed, then you may take acetaminophen (Tylenol) or ibuprofen (Advil) as needed. Take your usually prescribed medications unless otherwise directed If you need a refill on your pain medication, please contact your pharmacy.  They will contact our office to request authorization.  Prescriptions will not be filled after 5pm or on week-ends. You should eat very light the first 24 hours after surgery, such as soup, crackers, pudding, etc.  Resume your normal diet the day after surgery. Most patients will experience some swelling and bruising in the breast.  Ice packs and a good support bra will help.  Swelling and bruising can take several days to resolve.  It is common to experience some constipation if taking pain medication after surgery.  Increasing fluid intake and taking a stool softener will usually help or prevent this problem from occurring.  A mild laxative (Milk of Magnesia or Miralax) should be taken according to package directions if there are no bowel movements after 48 hours. Unless discharge instructions indicate otherwise, you may remove your bandages 48 hours after surgery, and you may shower at that time.  You may have steri-strips (small skin tapes) in place directly over the incision.  These strips should be left on the skin for 7-10 days.   ACTIVITIES:  You may resume regular daily activities (gradually increasing) beginning the next day.  Wearing a good support bra or  sports bra minimizes pain and swelling.  You may have sexual intercourse when it is comfortable. You may drive when you no longer are taking prescription pain medication, you can comfortably wear a seatbelt, and you can safely maneuver your car and apply brakes. RETURN TO WORK:  ______________________________________________________________________________________ Heather Roberson should see your doctor in the office for a follow-up appointment approximately two weeks after your surgery.  Your doctor's nurse will typically make your follow-up appointment when she calls you with your pathology report.  Expect your pathology report 2-3 business days after your surgery.  You may call to check if you do not hear from [place] after three days. OTHER INSTRUCTIONS: _______________________________________________________________________________________________ _____________________________________________________________________________________________________________________________________ _____________________________________________________________________________________________________________________________________ _____________________________________________________________________________________________________________________________________  WHEN TO CALL YOUR DOCTOR: Fever over 101.0 Nausea and/or vomiting. Extreme swelling or bruising. Continued bleeding from incision. Increased pain, redness, or drainage from the incision.  The clinic staff is available to answer your questions during regular business hours.  Please don't hesitate to call and ask to speak to one of the nurses for clinical concerns.  If you have a medical emergency, go to the nearest emergency room or call 911.  A surgeon from [facility] is always on call at the hospital.  For further questions, please visit [email or web address]

## 2023-03-03 NOTE — Op Note (Addendum)
Preop diagnosis: Invasive ductal carcinoma left breast - triple-negative Postop diagnosis: Same Procedure performed: Ultrasound guided right internal jugular vein port placement Left radioactive seed localized lumpectomy and sentinel lymph node biopsy Surgeon:Aditi Rovira K Normajean Nash Anesthesia: General via LMA/ Pec block Indications: This is a 72 year old female with multiple medical issues who presents with recent discovery of a palpable mass in the left upper outer quadrant of her breast near the axilla. She underwent mammogram and ultrasound that revealed a 0.7 x 0.6 x 0.8 cm mass located at 0100, 10 cmfn. Biopsy revealed IDC-3, triple negative, Ki67 80%. No family history of breast cancer. No previous breast problems.    Description of procedure: The patient is brought to the operating room placed in the supine position on the operating table.  After an adequate level of general anesthesia was obtained, the patient right arm was tucked at her side.  Her right chest and neck were prepped with ChloraPrep and draped sterile fashion.  A timeout was taken to ensure the proper patient and proper procedure.  She was placed in Trendelenburg position.  We interrogated her neck with the ultrasound.  The jugular vein is easily identified.  Using ultrasound guidance we directly cannulated the internal jugular vein with good blood return.  The wire passed easily.  Fluoroscopy confirmed that the wire headed down the right side of the mediastinum.  The needle was removed.  We created a subcutaneous pocket below the right clavicle.  We first anesthetized with local anesthetic.  We created a subcutaneous tunnel from the subcutaneous pocket to the insertion site on the neck.  An 8 French [place] port was assembled and was tunneled from the subcutaneous pocket to the insertion site.  The catheter was cut to the appropriate length using fluoroscopic guidance.  Using fluoroscopic guidance, we passed the dilator and breakaway  sheath over the wire.  The wire and dilator were removed.  The catheter was then advanced through the sheath which was removed.  Fluoroscopy confirmed that there were no kinks along the length of the catheter.  We are able to aspirate blood easily through the port and were able to flush easily.  The port was secured with two interrupted 2-0 Prolene sutures.  3-0 Vicryl was used to close the subcutaneous tissue and 4-0 Monocryl was used to close the skin at both sites.  Benzoin and Steri-Strips were applied. We instilled concentrated heparin solution into the port.   An occlusive dressing was placed.   All sponge, instrument, and needle counts are correct.      We took down the drapes, repositioned the patient. Her left breast and axilla were prepped with ChloraPrep and draped in sterile fashion. A timeout was taken to ensure the proper patient and proper procedure. We interrogated the breast with the neoprobe. The seed is very close to the axilla.  We made a transverse incision over the area of activity after infiltrating with 0.25% Marcaine. Dissection was carried down in the breast tissue with cautery. We used the neoprobe to guide [place] towards the radioactive seed. We excised an area of tissue around the radioactive seed 2 cm in diameter. The specimen was removed and was oriented with a paint kit. Specimen mammogram showed the radioactive seed as well as the biopsy clip within the specimen. This was sent for pathologic examination. There is no residual radioactivity within the biopsy cavity. Clips were placed in all five margins.  We inspected carefully for hemostasis. The wound was thoroughly irrigated.  We then turned our attention to the axilla through the same incision The settings were adjusted on the Neoprobe and we interrogated the axilla.  We dissected into the axilla and identified a radioactive lymph node.  This was dissected free and sent as "sentinel lymph node #1".  There was minimal background  activity.  The wound was closed with a deep layer of 3-0 Vicryl and a subcuticular layer of 4-0 Monocryl. Benzoin Steri-Strips were applied. The patient was then extubated and brought to the recovery room in stable condition. All sponge, instrument, and needle counts are correct.  Post-op chest x-ray is pending.  Wilmon Arms. Corliss Skains, MD, [facility] Surgery  General Surgery   03/03/2023 9:11 AM

## 2023-03-03 NOTE — Anesthesia Postprocedure Evaluation (Signed)
Anesthesia Post Note  Patient: PEARLENA HENNIGH  Procedure(s) Performed: LEFT BREAST LUMPECTOMY WITH RADIOACTIVE SEED AND AXILLARY SENTINEL LYMPH NODE BIOPSY (Left: Breast) INSERTION PORT-A-CATH (Right: Chest)     Patient location during evaluation: PACU Anesthesia Type: Regional and General Level of consciousness: awake and alert Pain management: pain level controlled Vital Signs Assessment: post-procedure vital signs reviewed and stable Respiratory status: spontaneous breathing, nonlabored ventilation, respiratory function stable and patient connected to nasal cannula oxygen Cardiovascular status: blood pressure returned to baseline and stable Postop Assessment: no apparent nausea or vomiting Anesthetic complications: no   No notable events documented.  Last Vitals:  Vitals:   03/03/23 0945 03/03/23 1009  BP: (!) 164/81 (!) 163/84  Pulse: 76 80  Resp: 15 19  Temp:  36.4 C  SpO2: 100% 100%    Last Pain:  Vitals:   03/03/23 1009  TempSrc:   PainSc: 0-No pain                 Earl Lites P Willmer Fellers

## 2023-03-03 NOTE — Anesthesia Procedure Notes (Signed)
Procedure Name: LMA Insertion Date/Time: 03/03/2023 7:37 AM  Performed by: Quentin Ore, CRNAPre-anesthesia Checklist: Patient identified, Emergency Drugs available, Patient being monitored and Suction available Patient Re-evaluated:Patient Re-evaluated prior to induction Oxygen Delivery Method: Circle system utilized Preoxygenation: Pre-oxygenation with 100% oxygen Induction Type: IV induction LMA: LMA inserted LMA Size: 3.0 Number of attempts: 1 Placement Confirmation: breath sounds checked- equal and bilateral and positive ETCO2 Tube secured with: Tape Dental Injury: Teeth and Oropharynx as per pre-operative assessment

## 2023-03-03 NOTE — Transfer of Care (Signed)
Immediate Anesthesia Transfer of Care Note  Patient: Heather Roberson  Procedure(s) Performed: LEFT BREAST LUMPECTOMY WITH RADIOACTIVE SEED AND AXILLARY SENTINEL LYMPH NODE BIOPSY (Left: Breast) INSERTION PORT-A-CATH (Right: Chest)  Patient Location: PACU  Anesthesia Type:GA combined with regional for post-op pain  Level of Consciousness: awake, alert , and oriented  Airway & Oxygen Therapy: Patient Spontanous Breathing and Patient connected to nasal cannula oxygen  Post-op Assessment: Report given to RN, Post -op Vital signs reviewed and stable, and Patient moving all extremities X 4  Post vital signs: Reviewed and stable  Last Vitals:  Vitals Value Taken Time  BP    Temp    Pulse    Resp 17 03/03/23 0917  SpO2    Vitals shown include unvalidated device data.  Last Pain:  Vitals:   03/03/23 0629  TempSrc:   PainSc: 0-No pain         Complications: No notable events documented.

## 2023-03-03 NOTE — Anesthesia Preprocedure Evaluation (Signed)
Anesthesia Evaluation  Patient identified by MRN, date of birth, ID band Patient awake    Reviewed: Allergy & Precautions, NPO status , Patient's Chart, lab work & pertinent test results  Airway Mallampati: II  TM Distance: >3 FB Neck ROM: Full    Dental  (+) Upper Dentures, Lower Dentures   Pulmonary neg pulmonary ROS   Pulmonary exam normal        Cardiovascular hypertension, Pt. on medications  Rhythm:Regular Rate:Normal     Neuro/Psych negative neurological ROS  negative psych ROS   GI/Hepatic negative GI ROS, Neg liver ROS,,,  Endo/Other  negative endocrine ROS    Renal/GU negative Renal ROS  negative genitourinary   Musculoskeletal  (+) Arthritis , Osteoarthritis,  Left breast IDC   Abdominal Normal abdominal exam  (+)   Peds  Hematology Lab Results      Component                Value               Date                      WBC                      4.5                 02/18/2023                HGB                      12.4                02/18/2023                HCT                      37.2                02/18/2023                MCV                      98.7                02/18/2023                PLT                      192                 02/18/2023             Lab Results      Component                Value               Date                      NA                       141                 02/18/2023                K  3.8                 02/18/2023                CO2                      29                  02/18/2023                GLUCOSE                  107 (H)             02/18/2023                BUN                      18                  02/18/2023                CREATININE               0.82                02/18/2023                CALCIUM                  9.3                 02/18/2023                GFRNONAA                 >60                 02/18/2023               Anesthesia Other Findings   Reproductive/Obstetrics                             Anesthesia Physical Anesthesia Plan  ASA: 3  Anesthesia Plan: General and Regional   Post-op Pain Management: Regional block* and Tylenol PO (pre-op)*   Induction: Intravenous  PONV Risk Score and Plan: 3 and Ondansetron, Dexamethasone and Treatment may vary due to age or medical condition  Airway Management Planned: Mask and LMA  Additional Equipment: None  Intra-op Plan:   Post-operative Plan: Extubation in OR  Informed Consent: I have reviewed the patients History and Physical, chart, labs and discussed the procedure including the risks, benefits and alternatives for the proposed anesthesia with the patient or authorized representative who has indicated his/her understanding and acceptance.     Dental advisory given  Plan Discussed with: CRNA  Anesthesia Plan Comments:        Anesthesia Quick Evaluation

## 2023-03-03 NOTE — Anesthesia Procedure Notes (Signed)
Anesthesia Regional Block: Pectoralis block   Pre-Anesthetic Checklist: , timeout performed,  Correct Patient, Correct Site, Correct Laterality,  Correct Procedure, Correct Position, site marked,  Risks and benefits discussed,  Surgical consent,  Pre-op evaluation,  At surgeon's request and post-op pain management  Laterality: Left  Prep: Dura Prep       Needles:  Injection technique: Single-shot  Needle Type: Echogenic Stimulator Needle     Needle Length: 10cm  Needle Gauge: 20     Additional Needles:   Procedures:,,,, ultrasound used (permanent image in chart),,    Narrative:  Start time: 03/03/2023 7:00 AM End time: 03/03/2023 7:05 AM Injection made incrementally with aspirations every 5 mL.  Performed by: Personally  Anesthesiologist: Atilano Median, DO  Additional Notes: Patient identified. Risks/Benefits/Options discussed with patient including but not limited to bleeding, infection, nerve damage, failed block, incomplete pain control. Patient expressed understanding and wished to proceed. All questions were answered. Sterile technique was used throughout the entire procedure. Please see nursing notes for vital signs. Aspirated in 5cc intervals with injection for negative confirmation. Patient was given instructions on fall risk and not to get out of bed. All questions and concerns addressed with instructions to call with any issues or inadequate analgesia.

## 2023-03-03 NOTE — Interval H&P Note (Signed)
History and Physical Interval Note:  03/03/2023 6:57 AM  Heather Roberson  has presented today for surgery, with the diagnosis of LEFT BREAST INVASIVE DUCTAL CARCINOMA.  The various methods of treatment have been discussed with the patient and family. After consideration of risks, benefits and other options for treatment, the patient has consented to  Procedure(s): LEFT BREAST LUMPECTOMY WITH RADIOACTIVE SEED AND AXILLARY SENTINEL LYMPH NODE BIOPSY (Left) INSERTION PORT-A-CATH (N/A) as a surgical intervention.  The patient's history has been reviewed, patient examined, no change in status, stable for surgery.  I have reviewed the patient's chart and labs.  Questions were answered to the patient's satisfaction.     Wynona Luna

## 2023-03-04 ENCOUNTER — Encounter (HOSPITAL_COMMUNITY): Payer: Self-pay | Admitting: Surgery

## 2023-03-05 LAB — SURGICAL PATHOLOGY

## 2023-03-06 ENCOUNTER — Encounter: Payer: Self-pay | Admitting: *Deleted

## 2023-03-13 ENCOUNTER — Encounter: Payer: Self-pay | Admitting: Genetic Counselor

## 2023-03-13 ENCOUNTER — Telehealth: Payer: Self-pay | Admitting: Genetic Counselor

## 2023-03-13 ENCOUNTER — Ambulatory Visit: Payer: Self-pay | Admitting: Genetic Counselor

## 2023-03-13 DIAGNOSIS — Z1379 Encounter for other screening for genetic and chromosomal anomalies: Secondary | ICD-10-CM

## 2023-03-13 NOTE — Progress Notes (Signed)
HPI:   Heather Roberson was previously seen in the [facility] due to a personal history of cancer and concerns regarding a hereditary predisposition to cancer. Please refer to our prior cancer genetics clinic note for more information regarding our discussion, assessment and recommendations, at the time. Heather Roberson recent genetic test results were disclosed to her, as were recommendations warranted by these results. These results and recommendations are discussed in more detail below.  CANCER HISTORY:  Oncology History  Malignant neoplasm of upper-outer quadrant of left breast in female, estrogen receptor negative (HCC)  01/28/2023 Mammogram   Mammogram showed highly suspicious mass in the left breast at 1 0 clock measuring 8 mm. Targeted ultrasound performed in the left breast at 1 o'clock 10 cm from the nipple demonstrating an irregular hypoechoic mass measuring 0.7 x 0.6 x 0.8 cm. This corresponds to the mammographic finding.   Targeted ultrasound the left axilla demonstrates normal lymph nodes.   02/09/2023 Pathology Results   Pathology results showed grade 3 IDC, triple neg, Ki 67 80%   02/16/2023 Initial Diagnosis   Malignant neoplasm of upper-outer quadrant of left breast in female, estrogen receptor negative (HCC)     FAMILY HISTORY:  We obtained a detailed, 4-generation family history.  Heather Roberson reports no known family history of cancer. She is unaware of previous family history of genetic testing for hereditary cancer risks. There is no reported Ashkenazi Jewish ancestry.        GENETIC TEST RESULTS:  The [facility] Custom Panel found no pathogenic mutations.  The Custom Hereditary Cancers Panel offered by [facility] includes sequencing and/or deletion duplication testing of the following 43 genes: APC, ATM, AXIN2, BAP1, BARD1, BMPR1A, BRCA1, BRCA2, BRIP1, CDH1, CDK4, CDKN2A (p14ARF and p16INK4a only), CHEK2, CTNNA1, EPCAM (Deletion/duplication testing only), FH,  GREM1 (promoter region duplication testing only), HOXB13, KIT, MBD4, MEN1, MLH1, MSH2, MSH3, MSH6, MUTYH, NF1, NHTL1, PALB2, PDGFRA, PMS2, POLD1, POLE, PTEN, RAD51C, RAD51D, SMAD4, SMARCA4. STK11, TP53, TSC1, TSC2, and VHL.  The test report has been scanned into EPIC and is located under the Molecular Pathology section of the Results Review tab.  A portion of the result report is included below for reference. Genetic testing reported out on 03/02/2023.     Even though a pathogenic variant was not identified, possible explanations for her personal history of cancer may include: There may be no hereditary risk for cancer in the family. Her personal history of cancer may be due to other genetic or environmental factors. There may be a gene mutation in one of these genes that current testing methods cannot detect, but that chance is small. There could be another gene that has not yet been discovered, or that we have not yet tested, that is responsible for the cancer diagnoses in the family.   Therefore, it is important to remain in touch with cancer genetics in the future so that we can continue to offer Heather Roberson the most up to date genetic testing.   ADDITIONAL GENETIC TESTING:  We discussed with Heather Roberson that her genetic testing was fairly extensive.  If there are genes identified to increase cancer risk that can be analyzed in the future, we would be happy to discuss and coordinate this testing at that time.    CANCER SCREENING RECOMMENDATIONS:  Heather Roberson test result is considered negative (normal).  This means that we have not identified a hereditary cause for her personal history of cancer at this time. Most cancers happen by  chance and this negative test suggests that her cancer may fall into this category.    An individual's cancer risk and medical management are not determined by genetic test results alone. Overall cancer risk assessment incorporates additional factors, including  personal medical history, family history, and any available genetic information that may result in a personalized plan for cancer prevention and surveillance. Therefore, it is recommended she continue to follow the cancer management and screening guidelines provided by her oncology and primary healthcare provider.  RECOMMENDATIONS FOR FAMILY MEMBERS:   Since she did not inherit a mutation in a cancer predisposition gene included on this panel, her children could not have inherited a mutation from her in one of these genes. Individuals in this family might be at some increased risk of developing cancer, over the general population risk, due to the family history of cancer. We recommend women in this family have a yearly mammogram beginning at age 75, or 10 years younger than the earliest onset of cancer, an annual clinical breast exam, and perform monthly breast self-exams.  FOLLOW-UP:  Cancer genetics is a rapidly advancing field and it is possible that new genetic tests will be appropriate for her and/or her family members in the future. We encouraged her to remain in contact with cancer genetics on an annual basis so we can update her personal and family histories and let her know of advances in cancer genetics that may benefit this family.   Our contact number was provided. Heather Roberson questions were answered to her satisfaction, and she knows she is welcome to call us at anytime with additional questions or concerns.   Lalla Brothers, MS, [facility] Genetic Counselor [email or web address] (P) [phone]

## 2023-03-13 NOTE — Telephone Encounter (Addendum)
I contacted Ms. Suber to discuss her genetic testing results. No pathogenic variants were identified in the 43 genes analyzed. Detailed clinic note to follow.  The test report has been scanned into EPIC and is located under the Molecular Pathology section of the Results Review tab.  A portion of the result report is included below for reference.   Lalla Brothers, MS, [facility] Genetic Counselor [email or web address] (P) [phone]

## 2023-03-17 ENCOUNTER — Encounter (HOSPITAL_COMMUNITY): Payer: Self-pay | Admitting: Surgery

## 2023-03-23 ENCOUNTER — Encounter (HOSPITAL_COMMUNITY): Payer: Self-pay | Admitting: Surgery

## 2023-03-25 NOTE — Therapy (Signed)
OUTPATIENT PHYSICAL THERAPY BREAST CANCER POST OP FOLLOW UP   Patient Name: Heather Roberson MRN: [id number] DOB:[birth year], 72 y.o., female Today's Date: 03/26/2023  END OF SESSION:  PT End of Session - 03/26/23 1201     Visit Number 2    Number of Visits 2    Date for PT Re-Evaluation 03/26/23    PT Start Time 1202    PT Stop Time 1238    PT Time Calculation (min) 36 min    Activity Tolerance Patient tolerated treatment well    Behavior During Therapy [facility] for tasks assessed/performed             Past Medical History:  Diagnosis Date   Arthritis    Hypertension    Past Surgical History:  Procedure Laterality Date   ABDOMINAL HYSTERECTOMY     BREAST BIOPSY Left 02/09/2023   [place] LT BREAST BX W LOC DEV 1ST LESION IMG BX SPEC US GUIDE 02/09/2023 [facility]   BREAST BIOPSY Left 03/02/2023   [place] LT RADIOACTIVE SEED LOC 03/02/2023 [facility]   BREAST LUMPECTOMY WITH RADIOACTIVE SEED AND SENTINEL LYMPH NODE BIOPSY Left 03/03/2023   Procedure: LEFT BREAST LUMPECTOMY WITH RADIOACTIVE SEED AND AXILLARY SENTINEL LYMPH NODE BIOPSY;  Surgeon: Manus Rudd, MD;  Location: [facility] OR;  Service: General;  Laterality: Left;   CHOLECYSTECTOMY     PORTACATH PLACEMENT Right 03/03/2023   Procedure: INSERTION PORT-A-CATH;  Surgeon: Manus Rudd, MD;  Location: [facility] OR;  Service: General;  Laterality: Right;   Patient Active Problem List   Diagnosis Date Noted   Genetic testing 03/13/2023   Malignant neoplasm of upper-outer quadrant of left breast in female, estrogen receptor negative (HCC) 02/16/2023   Connective tissue disease (HCC) 02/21/2022   Essential hypertension 02/21/2022   Multiple joint pain 02/21/2022   Osteoarthritis 02/21/2022   Osteopenia 02/21/2022   Rheumatoid arthritis (HCC) 03/28/2019   Pain in finger of right hand 12/14/2016   Paronychia of right thumb 12/14/2016      REFERRING PROVIDER: Dr. Manus Rudd  REFERRING DIAG:Left Breast Cancer   THERAPY  DIAG:  Malignant neoplasm of upper-outer quadrant of left breast in female, estrogen receptor negative (HCC)  Abnormal posture  Rationale for Evaluation and Treatment: Rehabilitation  ONSET DATE: 01/28/23  SUBJECTIVE:                                                                                                                                                                                           SUBJECTIVE STATEMENT: Surgery went well. I have been doing the exercises and my ROM feels good. I have no pain  PERTINENT HISTORY:  Patient was diagnosed on 01/28/2023 with left grade 3 invasive ductal carcinoma breast cancer. It measures 8 mm and is located in the upper outer quadrant. It is triple negative with a Ki67 of 80%. She has rheumatoid arthritis which impacts hand function . Pt had a left lumpectomy with 0/2 LN's on 03/03/2023.  PATIENT GOALS:  Reassess how my recovery is going related to arm function, pain, and swelling.  PAIN:  Are you having pain? No  PRECAUTIONS: Recent Surgery, left UE Lymphedema risk, Rheumatoid arthritis  ACTIVITY LEVEL / LEISURE: cooking and straightening up house   OBJECTIVE:   PATIENT SURVEYS:  QUICK DASH: 5%  OBSERVATIONS: Steri strips still on incision but no visible swelling. Portacath placed on right with steri strips still present  POSTURE:  Forward head, rounded shoulders  LYMPHEDEMA ASSESSMENT:  UPPER EXTREMITY AROM/PROM:   A/PROM RIGHT   eval    Shoulder extension 56  Shoulder flexion 144  Shoulder abduction 131  Shoulder internal rotation 70  Shoulder external rotation 84                          (Blank rows = not tested)   A/PROM LEFT   eval LEFT 03/26/23  Shoulder extension 50 60  Shoulder flexion 140 152  Shoulder abduction 145 155  Shoulder internal rotation 61 63  Shoulder external rotation 82 100                          (Blank rows = not tested)   CERVICAL AROM: All within normal limits:      Percent limited   Flexion WNL  Extension WNL  Right lateral flexion 25% limited  Left lateral flexion 25% limited  Right rotation 50% limited  Left rotation 50% limited      UPPER EXTREMITY STRENGTH: WNL   LYMPHEDEMA ASSESSMENTS:    LANDMARK RIGHT   eval  10 cm proximal to olecranon process 23.7  Olecranon process 21  10 cm proximal to ulnar styloid process 16.2  Just proximal to ulnar styloid process 13.8  Across hand at thumb web space 18.5  At base of 2nd digit 5.9  (Blank rows = not tested)   LANDMARK LEFT   eval LEFT 03/26/23  10 cm proximal to olecranon process 24 23.0  Olecranon process 21.2 20.9  10 cm proximal to ulnar styloid process 16.2 16.3  Just proximal to ulnar styloid process 13.5 13.6  Across hand at thumb web space 17.8 18.2  At base of 2nd digit 5.9 5.8  (Blank rows = not tested)   Surgery type/Date: 03/03/2023 Left Breast Lumpectomy with SLNB Number of lymph nodes removed: 0+/2 Current/past treatment (chemo, radiation, hormone therapy): Pending chemo and radiation Other symptoms:  Heaviness/tightness No Pain No Pitting edema No Infections No Decreased scar mobility Yes Stemmer sign No  PATIENT EDUCATION:  Education details: Scar massage, SOZO, ABC class, sports bra during radiation if having to prevent swelling,continue exercises for duration of radiation if having Person educated: Patient Education method: Explanation and Handouts Education comprehension: verbalized understanding  HOME EXERCISE PROGRAM: Reviewed previously given post op HEP.   ASSESSMENT:  CLINICAL IMPRESSION: Pt is a 72 year old female s/p left lumpectomy with SLNB on 03/03/2023 with 0/2 LN's for triple negative IDC. She is pending chemotherapy and radiation  Pt will benefit from skilled therapeutic intervention to improve on the following deficits: Decreased knowledge of precautions, impaired UE functional use,  pain, decreased ROM, postural dysfunction.   PT treatment/interventions:  ADL/Self care home management, Therapeutic exercises, Patient/Family education, and Self Care   GOALS: Goals reviewed with patient? Yes  LONG TERM GOALS:  (STG=LTG)  GOALS Name Target Date  Goal status  1 Pt will demonstrate she has regained full shoulder ROM and function post operatively compared to baselines.  Baseline: 03/26/2023 MET  2     3     4         PLAN:  PT FREQUENCY/DURATION: No further therapy visits scheduled for PT. Pt set up for ABC class and SOZO screen  PLAN FOR NEXT SESSION: No further needs identified. Pt advised to call with questions or concerns. PHYSICAL THERAPY DISCHARGE SUMMARY  Visits from Start of Care: 2  Current functional level related to goals / functional outcomes: Achieved goals   Remaining deficits: None   Education / Equipment: HEP, SOZO, ABC class   Patient agrees to discharge. Patient goals were met. Patient is being discharged due to meeting the stated rehab goals. Pt advised to contact us with questions or concerns.   [facility]  [address], [address]  [place] [address]  [phone]  After Breast Cancer Class It is recommended you attend the ABC class to be educated on lymphedema risk reduction. This class is free of charge and lasts for 1 hour. It is a 1-time class. You will need to download the TEAMS app either on your phone or computer. We will send you a link the night before or the morning of the class. You should be able to click on that link to join the class. This is not a confidential class. You don't have to turn your camera on, but other participants may be able to see your email address.  Scar massage You can begin gentle scar massage to you incision sites. Gently place one hand on the incision and move the skin (without sliding on the skin) in various directions. Do this for a few minutes and then you can gently massage either coconut oil or vitamin E cream into the scars.  Compression  garment You should continue wearing your compression bra until you feel like you no longer have swelling.  Home exercise Program Continue doing the exercises you were given until you feel like you can do them without feeling any tightness at the end.   Walking Program Studies show that 30 minutes of walking per day (fast enough to elevate your heart rate) can significantly reduce the risk of a cancer recurrence. If you can't walk due to other medical reasons, we encourage you to find another activity you could do (like a stationary bike or water exercise).  Posture After breast cancer surgery, people frequently sit with rounded shoulders posture because it puts their incisions on slack and feels better. If you sit like this and scar tissue forms in that position, you can become very tight and have pain sitting or standing with good posture. Try to be aware of your posture and sit and stand up tall to heal properly.  Follow up PT: It is recommended you return every 3 months for the first 3 years following surgery to be assessed on the SOZO machine for an L-Dex score. This helps prevent clinically significant lymphedema in 95% of patients. These follow up screens are 10 minute appointments that you are not billed for.  Waynette Buttery, PT 03/26/2023, 12:39 PM

## 2023-03-26 ENCOUNTER — Ambulatory Visit: Payer: Medicare PPO | Attending: Surgery

## 2023-03-26 ENCOUNTER — Inpatient Hospital Stay: Payer: Medicare PPO | Attending: Hematology and Oncology | Admitting: Hematology and Oncology

## 2023-03-26 VITALS — BP 112/67 | HR 100 | Temp 97.7°F | Resp 18 | Wt 110.7 lb

## 2023-03-26 DIAGNOSIS — M069 Rheumatoid arthritis, unspecified: Secondary | ICD-10-CM | POA: Diagnosis not present

## 2023-03-26 DIAGNOSIS — I10 Essential (primary) hypertension: Secondary | ICD-10-CM | POA: Diagnosis not present

## 2023-03-26 DIAGNOSIS — C50412 Malignant neoplasm of upper-outer quadrant of left female breast: Secondary | ICD-10-CM | POA: Insufficient documentation

## 2023-03-26 DIAGNOSIS — Z171 Estrogen receptor negative status [ER-]: Secondary | ICD-10-CM | POA: Insufficient documentation

## 2023-03-26 DIAGNOSIS — R293 Abnormal posture: Secondary | ICD-10-CM | POA: Diagnosis present

## 2023-03-26 DIAGNOSIS — Z79899 Other long term (current) drug therapy: Secondary | ICD-10-CM | POA: Diagnosis not present

## 2023-03-26 NOTE — Assessment & Plan Note (Signed)
This is a very pleasant 72 yr old female patient with past medical history significant for hypertension, rheumatoid arthritis on hydroxychloroquine now diagnosed with left breast invasive ductal carcinoma, high-grade, triple negative referred to breast [facility] for additional recommendations.  Since her last visit here she had lumpectomy which showed 0.9 cm grade 3 IDC, triple negative, negative margins, 2 sentinel lymph nodes negative Physical examination today deferred in lieu of counseling.  We have discussed that in triple negative cancers over 5 mm we generally do recommend adjuvant chemotherapy.  I have previously discussed this in detail.  She however has no memory of it, has no clear understanding of the need for adjuvant chemotherapy despite multiple repetitions.  She also appears overtly frail and Kept asking me why she needs to do chemotherapy if she had surgery and she did not need any pain medication.  Hence we have discussed that in case she does agree to adjuvant chemotherapy, we will try adjuvant CMF.  She will otherwise proceed with adjuvant radiation.  No role for antiestrogen therapy. Thank you for consulting [place] in the care of this patient.  Please do not hesitate to contact us with any additional questions or concerns.

## 2023-03-26 NOTE — Progress Notes (Signed)
[facility] CONSULT NOTE  Patient Care Team: Loura Back, NP as PCP - General (Nurse Practitioner) Rachel Moulds, MD as Consulting Physician (Hematology and Oncology) Antony Blackbird, MD as Consulting Physician (Radiation Oncology) Manus Rudd, MD as Consulting Physician (General Surgery) Donnelly Angelica, RN as Oncology Nurse Navigator Pershing Proud, RN as Oncology Nurse Navigator  CHIEF COMPLAINTS/PURPOSE OF CONSULTATION:  Newly diagnosed breast cancer  HISTORY OF PRESENTING ILLNESS:  Heather Roberson 72 y.o. female is here because of recent diagnosis of left IDC  I reviewed her records extensively and collaborated the history with the patient.  SUMMARY OF ONCOLOGIC HISTORY: Oncology History  Malignant neoplasm of upper-outer quadrant of left breast in female, estrogen receptor negative (HCC)  01/28/2023 Mammogram   Mammogram showed highly suspicious mass in the left breast at 1 0 clock measuring 8 mm. Targeted ultrasound performed in the left breast at 1 o'clock 10 cm from the nipple demonstrating an irregular hypoechoic mass measuring 0.7 x 0.6 x 0.8 cm. This corresponds to the mammographic finding.   Targeted ultrasound the left axilla demonstrates normal lymph nodes.   02/09/2023 Pathology Results   Pathology results showed grade 3 IDC, triple neg, Ki 67 80%   02/16/2023 Initial Diagnosis   Malignant neoplasm of upper-outer quadrant of left breast in female, estrogen receptor negative (HCC)    Genetic Testing   [facility] Custom Panel+RNA was Negative. Report date is 03/02/2023.  The Custom Hereditary Cancers Panel offered by [facility] includes sequencing and/or deletion duplication testing of the following 43 genes: APC, ATM, AXIN2, BAP1, BARD1, BMPR1A, BRCA1, BRCA2, BRIP1, CDH1, CDK4, CDKN2A (p14ARF and p16INK4a only), CHEK2, CTNNA1, EPCAM (Deletion/duplication testing only), FH, GREM1 (promoter region duplication testing only), HOXB13, KIT, MBD4, MEN1, MLH1,  MSH2, MSH3, MSH6, MUTYH, NF1, NHTL1, PALB2, PDGFRA, PMS2, POLD1, POLE, PTEN, RAD51C, RAD51D, SMAD4, SMARCA4. STK11, TP53, TSC1, TSC2, and VHL.    Since her last visit here, she had left breast lumpectomy which showed 0.9 cm grade 3 IDC, DCIS, negative margins.  Prior prognostic markers showed ER 0% negative, PR 0% negative, HER2 0 negative, Ki-67 80%, 2 sentinel lymph nodes negative for malignancy. Rest of the pertinent 10 point ROS reviewed and neg.  MEDICAL HISTORY:  Past Medical History:  Diagnosis Date   Arthritis    Hypertension     SURGICAL HISTORY: Past Surgical History:  Procedure Laterality Date   ABDOMINAL HYSTERECTOMY     BREAST BIOPSY Left 02/09/2023   [place] LT BREAST BX W LOC DEV 1ST LESION IMG BX SPEC US GUIDE 02/09/2023 [facility]   BREAST BIOPSY Left 03/02/2023   [place] LT RADIOACTIVE SEED LOC 03/02/2023 [facility]MO[facility]   BREAST LUMPECTOMY WITH RADIOACTIVE SEED AND SENTINEL LYMPH NODE BIOPSY Left 03/03/2023   Procedure: LEFT BREAST LUMPECTOMY WITH RADIOACTIVE SEED AND AXILLARY SENTINEL LYMPH NODE BIOPSY;  Surgeon: Manus Rudd, MD;  Location: [facility] OR;  Service: General;  Laterality: Left;   CHOLECYSTECTOMY     PORTACATH PLACEMENT Right 03/03/2023   Procedure: INSERTION PORT-A-CATH;  Surgeon: Manus Rudd, MD;  Location: [facility] OR;  Service: General;  Laterality: Right;    SOCIAL HISTORY: Social History   Socioeconomic History   Marital status: Divorced    Spouse name: Not on file   Number of children: Not on file   Years of education: Not on file   Highest education level: Not on file  Occupational History   Not on file  Tobacco Use   Smoking status: Never   Smokeless tobacco: Never  Vaping Use   Vaping Use: Never used  Substance and Sexual Activity   Alcohol use: No   Drug use: No   Sexual activity: Not on file  Other Topics Concern   Not on file  Social History Narrative   Not on file   Social Determinants of Health   Financial Resource Strain:  Medium Risk (02/18/2023)   Overall Financial Resource Strain (CARDIA)    Difficulty of Paying Living Expenses: Somewhat hard  Food Insecurity: Food Insecurity Present (02/18/2023)   Hunger Vital Sign    Worried About Running Out of Food in the Last Year: Sometimes true    Ran Out of Food in the Last Year: Sometimes true  Transportation Needs: No Transportation Needs (02/18/2023)   PRAPARE - Administrator, Civil Service (Medical): No    Lack of Transportation (Non-Medical): No  Physical Activity: Not on file  Stress: Not on file  Social Connections: Not on file  Intimate Partner Violence: Not on file    FAMILY HISTORY: Family History  Family history unknown: Yes    ALLERGIES:  is allergic to other.  MEDICATIONS:  Current Outpatient Medications  Medication Sig Dispense Refill   acetaminophen (TYLENOL) 500 MG tablet Take 500 mg by mouth every 6 (six) hours as needed for moderate pain.     amLODipine (NORVASC) 2.5 MG tablet Take 2.5 mg by mouth daily.     ascorbic acid (VITAMIN C) 500 MG tablet Take 500 mg by mouth daily.     Calcium Carb-Cholecalciferol (CALCIUM 600+D) 600-10 MG-MCG TABS Take 1 tablet by mouth daily at 6 (six) AM.     CALCIUM PO Take 1 tablet by mouth daily.     carbamide peroxide (DEBROX) 6.5 % OTIC solution Place 5 drops into both ears 2 (two) times daily. (Patient not taking: Reported on 02/24/2023) 15 mL 0   Carboxymethylcellul-Glycerin (CLEAR EYES FOR DRY EYES OP) Place 1-2 drops into both eyes 2 (two) times daily as needed (for dryness).     cholecalciferol (VITAMIN D) 1000 units tablet Take 1,000 Units by mouth daily.     ferrous sulfate 325 (65 FE) MG tablet Take 325 mg by mouth daily with breakfast.     fluticasone (FLONASE) 50 MCG/ACT nasal spray Place 1-2 sprays into both nostrils daily as needed for allergies or rhinitis.     hydroxychloroquine (PLAQUENIL) 200 MG tablet Take 200 mg by mouth daily.  0   levocetirizine (XYZAL) 5 MG tablet Take 5 mg by  mouth every evening.     mirtazapine (REMERON) 7.5 MG tablet Take 7.5 mg by mouth at bedtime.     omega-3 acid ethyl esters (LOVAZA) 1 g capsule Take 1 g by mouth daily.     predniSONE (DELTASONE) 5 MG tablet Take 5 mg by mouth daily as needed (For pain).     traMADol (ULTRAM) 50 MG tablet Take 1 tablet (50 mg total) by mouth every 6 (six) hours as needed for moderate pain or severe pain. 30 tablet 0   No current facility-administered medications for this visit.    PHYSICAL EXAMINATION: ECOG PERFORMANCE STATUS: 0 - Asymptomatic  Vitals:   03/26/23 1528  BP: 112/67  Pulse: 100  Resp: 18  Temp: 97.7 F (36.5 C)  SpO2: 100%    Filed Weights   03/26/23 1528  Weight: 110 lb 11.2 oz (50.2 kg)     GENERAL:alert, no distress and comfortable  LABORATORY DATA:  I have reviewed the data as listed Lab  Results  Component Value Date   WBC 4.5 02/18/2023   HGB 12.4 02/18/2023   HCT 37.2 02/18/2023   MCV 98.7 02/18/2023   PLT 192 02/18/2023   Lab Results  Component Value Date   NA 141 02/18/2023   K 3.8 02/18/2023   CL 105 02/18/2023   CO2 29 02/18/2023    RADIOGRAPHIC STUDIES: I have personally reviewed the radiological reports and agreed with the findings in the report.  ASSESSMENT AND PLAN:  Malignant neoplasm of upper-outer quadrant of left breast in female, estrogen receptor negative (HCC) This is a very pleasant 72 yr old female patient with past medical history significant for hypertension, rheumatoid arthritis on hydroxychloroquine now diagnosed with left breast invasive ductal carcinoma, high-grade, triple negative referred to breast [facility] for additional recommendations.  Since her last visit here she had lumpectomy which showed 0.9 cm grade 3 IDC, triple negative, negative margins, 2 sentinel lymph nodes negative Physical examination today deferred in lieu of counseling.  We have discussed that in triple negative cancers over 5 mm we generally do recommend adjuvant  chemotherapy.  I have previously discussed this in detail.  She however has no memory of it, has no clear understanding of the need for adjuvant chemotherapy despite multiple repetitions.  She also appears overtly frail and Kept asking me why she needs to do chemotherapy if she had surgery and she did not need any pain medication.  Hence we have discussed that in case she does agree to adjuvant chemotherapy, we will try adjuvant CMF.  She will otherwise proceed with adjuvant radiation.  No role for antiestrogen therapy. Thank you for consulting [place] in the care of this patient.  Please do not hesitate to contact us with any additional questions or concerns. I told her that we will call her back in 2 weeks to see if she wants to proceed with some form of chemotherapy.  She expressed understanding  Total time spent: 30 min All questions were answered. The patient knows to call the clinic with any problems, questions or concerns.    Rachel Moulds, MD 03/26/23

## 2023-03-26 NOTE — Patient Instructions (Signed)
     [facility]  [address], [address]  [place] [address]  [phone]  After Breast Cancer Class It is recommended you attend the ABC class to be educated on lymphedema risk reduction. This class is free of charge and lasts for 1 hour. It is a 1-time class. You will need to download the Webex app either on your phone or computer. We will send you a link the night before or the morning of the class. You should be able to click on that link to join the class. This is not a confidential class. You don't have to turn your camera on, but other participants may be able to see your email address.  Scar massage You can begin gentle scar massage to you incision sites. Gently place one hand on the incision and move the skin (without sliding on the skin) in various directions. Do this for a few minutes and then you can gently massage either coconut oil or vitamin E cream into the scars.  Compression garment You should continue wearing your compression bra until you feel like you no longer have swelling.  Home exercise Program Continue doing the exercises you were given until you feel like you can do them without feeling any tightness at the end.   Walking Program Studies show that 30 minutes of walking per day (fast enough to elevate your heart rate) can significantly reduce the risk of a cancer recurrence. If you can't walk due to other medical reasons, we encourage you to find another activity you could do (like a stationary bike or water exercise).  Posture After breast cancer surgery, people frequently sit with rounded shoulders posture because it puts their incisions on slack and feels better. If you sit like this and scar tissue forms in that position, you can become very tight and have pain sitting or standing with good posture. Try to be aware of your posture and sit and stand up tall to heal properly.  Follow up PT: It is recommended you return every 3 months for  the first 2 years following surgery to be assessed on the SOZO machine for an L-Dex score. This helps prevent clinically significant lymphedema in 95% of patients. These follow up screens are 10 minute appointments that you are not billed for.  

## 2023-03-27 ENCOUNTER — Encounter: Payer: Self-pay | Admitting: *Deleted

## 2023-03-27 ENCOUNTER — Telehealth: Payer: Self-pay | Admitting: Hematology and Oncology

## 2023-03-27 NOTE — Telephone Encounter (Signed)
Spoke with patient confirming upcoming appointment  

## 2023-04-09 ENCOUNTER — Telehealth: Payer: Self-pay | Admitting: Radiation Oncology

## 2023-04-09 ENCOUNTER — Inpatient Hospital Stay (HOSPITAL_BASED_OUTPATIENT_CLINIC_OR_DEPARTMENT_OTHER): Payer: Medicare PPO | Admitting: Hematology and Oncology

## 2023-04-09 ENCOUNTER — Encounter: Payer: Self-pay | Admitting: *Deleted

## 2023-04-09 DIAGNOSIS — Z171 Estrogen receptor negative status [ER-]: Secondary | ICD-10-CM | POA: Diagnosis not present

## 2023-04-09 DIAGNOSIS — C50412 Malignant neoplasm of upper-outer quadrant of left female breast: Secondary | ICD-10-CM | POA: Diagnosis not present

## 2023-04-09 NOTE — Progress Notes (Signed)
[facility] CONSULT NOTE  Patient Care Team: Loura Back, NP as PCP - General (Nurse Practitioner) Rachel Moulds, MD as Consulting Physician (Hematology and Oncology) Antony Blackbird, MD as Consulting Physician (Radiation Oncology) Manus Rudd, MD as Consulting Physician (General Surgery) Donnelly Angelica, RN as Oncology Nurse Navigator Pershing Proud, RN as Oncology Nurse Navigator  CHIEF COMPLAINTS/PURPOSE OF CONSULTATION:  Newly diagnosed breast cancer  HISTORY OF PRESENTING ILLNESS:  Heather Roberson 72 y.o. female is here because of recent diagnosis of left IDC  I reviewed her records extensively and collaborated the history with the patient.  SUMMARY OF ONCOLOGIC HISTORY: Oncology History  Malignant neoplasm of upper-outer quadrant of left breast in female, estrogen receptor negative (HCC)  01/28/2023 Mammogram   Mammogram showed highly suspicious mass in the left breast at 1 0 clock measuring 8 mm. Targeted ultrasound performed in the left breast at 1 o'clock 10 cm from the nipple demonstrating an irregular hypoechoic mass measuring 0.7 x 0.6 x 0.8 cm. This corresponds to the mammographic finding.   Targeted ultrasound the left axilla demonstrates normal lymph nodes.   02/09/2023 Pathology Results   Pathology results showed grade 3 IDC, triple neg, Ki 67 80%   02/16/2023 Initial Diagnosis   Malignant neoplasm of upper-outer quadrant of left breast in female, estrogen receptor negative (HCC)    Genetic Testing   [facility] Custom Panel+RNA was Negative. Report date is 03/02/2023.  The Custom Hereditary Cancers Panel offered by [facility] includes sequencing and/or deletion duplication testing of the following 43 genes: APC, ATM, AXIN2, BAP1, BARD1, BMPR1A, BRCA1, BRCA2, BRIP1, CDH1, CDK4, CDKN2A (p14ARF and p16INK4a only), CHEK2, CTNNA1, EPCAM (Deletion/duplication testing only), FH, GREM1 (promoter region duplication testing only), HOXB13, KIT, MBD4, MEN1, MLH1,  MSH2, MSH3, MSH6, MUTYH, NF1, NHTL1, PALB2, PDGFRA, PMS2, POLD1, POLE, PTEN, RAD51C, RAD51D, SMAD4, SMARCA4. STK11, TP53, TSC1, TSC2, and VHL.   02/18/2023 Cancer Staging   Staging form: Breast, AJCC 8th Edition - Clinical stage from 02/18/2023: Stage IB (cT1b, cN0, cM0, G3, ER-, PR-, HER2-) - Signed by Rachel Moulds, MD on 04/09/2023 Stage prefix: Initial diagnosis Histologic grading system: 3 grade system    Since her last visit here, she had left breast lumpectomy which showed 0.9 cm grade 3 IDC, DCIS, negative margins.  Prior prognostic markers showed ER 0% negative, PR 0% negative, HER2 0 negative, Ki-67 80%, 2 sentinel lymph nodes negative for malignancy.  This is a telephone visit to follow up.  During her last visit we have discussed but consider adjuvant CMF for chemotherapy but she wanted to think about it.  She is today with her daughter for telephone follow-up.  She tells me that she has thought about it and does not want any form of adjuvant chemotherapy and would like to proceed with adjuvant radiation. Rest of the pertinent 10 point ROS reviewed and neg.  MEDICAL HISTORY:  Past Medical History:  Diagnosis Date   Arthritis    Hypertension     SURGICAL HISTORY: Past Surgical History:  Procedure Laterality Date   ABDOMINAL HYSTERECTOMY     BREAST BIOPSY Left 02/09/2023   [place] LT BREAST BX W LOC DEV 1ST LESION IMG BX SPEC US GUIDE 02/09/2023 [facility] MAMMOGRAPHY   BREAST BIOPSY Left 03/02/2023   [place] LT RADIOACTIVE SEED LOC 03/02/2023 [facility] MAMMOGRAPHY   BREAST LUMPECTOMY WITH RADIOACTIVE SEED AND SENTINEL LYMPH NODE BIOPSY Left 03/03/2023   Procedure: LEFT BREAST LUMPECTOMY WITH RADIOACTIVE SEED AND AXILLARY SENTINEL LYMPH NODE BIOPSY;  Surgeon: Corliss Skains,  Molli Hazard, MD;  Location: [facility] OR;  Service: General;  Laterality: Left;   CHOLECYSTECTOMY     PORTACATH PLACEMENT Right 03/03/2023   Procedure: INSERTION PORT-A-CATH;  Surgeon: Manus Rudd, MD;  Location: [facility] OR;  Service: General;   Laterality: Right;    SOCIAL HISTORY: Social History   Socioeconomic History   Marital status: Divorced    Spouse name: Not on file   Number of children: Not on file   Years of education: Not on file   Highest education level: Not on file  Occupational History   Not on file  Tobacco Use   Smoking status: Never   Smokeless tobacco: Never  Vaping Use   Vaping Use: Never used  Substance and Sexual Activity   Alcohol use: No   Drug use: No   Sexual activity: Not on file  Other Topics Concern   Not on file  Social History Narrative   Not on file   Social Determinants of Health   Financial Resource Strain: Medium Risk (02/18/2023)   Overall Financial Resource Strain (CARDIA)    Difficulty of Paying Living Expenses: Somewhat hard  Food Insecurity: Food Insecurity Present (02/18/2023)   Hunger Vital Sign    Worried About Running Out of Food in the Last Year: Sometimes true    Ran Out of Food in the Last Year: Sometimes true  Transportation Needs: No Transportation Needs (02/18/2023)   PRAPARE - Administrator, Civil Service (Medical): No    Lack of Transportation (Non-Medical): No  Physical Activity: Not on file  Stress: Not on file  Social Connections: Not on file  Intimate Partner Violence: Not on file    FAMILY HISTORY: Family History  Family history unknown: Yes    ALLERGIES:  is allergic to other.  MEDICATIONS:  Current Outpatient Medications  Medication Sig Dispense Refill   acetaminophen (TYLENOL) 500 MG tablet Take 500 mg by mouth every 6 (six) hours as needed for moderate pain.     amLODipine (NORVASC) 2.5 MG tablet Take 2.5 mg by mouth daily.     ascorbic acid (VITAMIN C) 500 MG tablet Take 500 mg by mouth daily.     Calcium Carb-Cholecalciferol (CALCIUM 600+D) 600-10 MG-MCG TABS Take 1 tablet by mouth daily at 6 (six) AM.     CALCIUM PO Take 1 tablet by mouth daily.     carbamide peroxide (DEBROX) 6.5 % OTIC solution Place 5 drops into both ears 2  (two) times daily. (Patient not taking: Reported on 02/24/2023) 15 mL 0   Carboxymethylcellul-Glycerin (CLEAR EYES FOR DRY EYES OP) Place 1-2 drops into both eyes 2 (two) times daily as needed (for dryness).     cholecalciferol (VITAMIN D) 1000 units tablet Take 1,000 Units by mouth daily.     ferrous sulfate 325 (65 FE) MG tablet Take 325 mg by mouth daily with breakfast.     fluticasone (FLONASE) 50 MCG/ACT nasal spray Place 1-2 sprays into both nostrils daily as needed for allergies or rhinitis.     hydroxychloroquine (PLAQUENIL) 200 MG tablet Take 200 mg by mouth daily.  0   levocetirizine (XYZAL) 5 MG tablet Take 5 mg by mouth every evening.     mirtazapine (REMERON) 7.5 MG tablet Take 7.5 mg by mouth at bedtime.     omega-3 acid ethyl esters (LOVAZA) 1 g capsule Take 1 g by mouth daily.     predniSONE (DELTASONE) 5 MG tablet Take 5 mg by mouth daily as needed (For pain).  traMADol (ULTRAM) 50 MG tablet Take 1 tablet (50 mg total) by mouth every 6 (six) hours as needed for moderate pain or severe pain. 30 tablet 0   No current facility-administered medications for this visit.    PHYSICAL EXAMINATION: ECOG PERFORMANCE STATUS: 0 - Asymptomatic  There were no vitals filed for this visit.   There were no vitals filed for this visit.   I connected with  Heather Roberson on 04/09/23 by a telephone application and verified that I am speaking with the correct person using two identifiers.   I discussed the limitations of evaluation and management by telemedicine. The patient expressed understanding and agreed to proceed.  LABORATORY DATA:  I have reviewed the data as listed Lab Results  Component Value Date   WBC 4.5 02/18/2023   HGB 12.4 02/18/2023   HCT 37.2 02/18/2023   MCV 98.7 02/18/2023   PLT 192 02/18/2023   Lab Results  Component Value Date   NA 141 02/18/2023   K 3.8 02/18/2023   CL 105 02/18/2023   CO2 29 02/18/2023    RADIOGRAPHIC STUDIES: I have personally  reviewed the radiological reports and agreed with the findings in the report.  ASSESSMENT AND PLAN:  Malignant neoplasm of upper-outer quadrant of left breast in female, estrogen receptor negative (HCC) This is a very pleasant 72 yr old female patient with past medical history significant for hypertension, rheumatoid arthritis on hydroxychloroquine now diagnosed with left breast invasive ductal carcinoma, high-grade, triple negative referred to breast MDC for additional recommendations.  Since her last visit here she had lumpectomy which showed 0.9 cm grade 3 IDC, triple negative, negative margins, 2 sentinel lymph nodes negative Physical examination today deferred in lieu of counseling.  We have discussed that in triple negative cancers over 5 mm we generally do recommend adjuvant chemotherapy.  I have previously discussed this in detail.  She however has no memory of it, has no clear understanding of the need for adjuvant chemotherapy despite multiple repetitions. I called her today to follow up on this conversation. She didn't answer.  Given her lapse of memory and frailty, I believe it is best to proceed with adjuvant radiation followed by observation.  She called back and did mention that she does not want to proceed with any adjuvant chemotherapy and would like to proceed with radiation.  I think this is very reasonable.  I will send a message to the radiation oncology team as well.  Will plan to see her back after radiation for surveillance.   Rachel Moulds, MD 04/09/23  Total time: 12 min

## 2023-04-09 NOTE — Assessment & Plan Note (Addendum)
This is a very pleasant 72 yr old female patient with past medical history significant for hypertension, rheumatoid arthritis on hydroxychloroquine now diagnosed with left breast invasive ductal carcinoma, high-grade, triple negative referred to breast MDC for additional recommendations.  Since her last visit here she had lumpectomy which showed 0.9 cm grade 3 IDC, triple negative, negative margins, 2 sentinel lymph nodes negative Physical examination today deferred in lieu of counseling.  We have discussed that in triple negative cancers over 5 mm we generally do recommend adjuvant chemotherapy.  I have previously discussed this in detail.  She however has no memory of it, has no clear understanding of the need for adjuvant chemotherapy despite multiple repetitions. I called her today to follow up on this conversation. She didn't answer.  Given her lapse of memory and frailty, I believe it is best to proceed with adjuvant radiation followed by observation.  She called back and did mention that she does not want to proceed with any adjuvant chemotherapy and would like to proceed with radiation.  I think this is very reasonable.  I will send a message to the radiation oncology team as well.  Will plan to see her back after radiation for surveillance.

## 2023-04-09 NOTE — Telephone Encounter (Signed)
Left message for patient to call back to schedule consult per 6/20 referral.

## 2023-04-11 NOTE — Progress Notes (Signed)
Radiation Oncology         [phone] ________________________________  Name: Heather Roberson MRN: [id number]  Date: 04/13/2023  DOB: 1951/08/10  Re-Evaluation Note  CC: Loura Back, NP  Rachel Moulds, MD  No diagnosis found.  Diagnosis:  Stage IB (cT1b, cN0, cM0)  Left Breast UOQ, Invasive and in situ ductal carcinoma, ER- / PR- / Her2-, Grade 3: s/p lumpectomy and SLN biopsies   Narrative:  The patient returns today to discuss radiation treatment options. She was seen in the multidisciplinary breast clinic on 02/18/23.   She underwent genetic testing on her consultation date. Results showed no clinically significant variants detected by [facility] genetic testing.   Since her consultation date, she opted to proceed with a left breast lumpectomy with left axillary SLN biopsies on 03/03/23 under the care of Dr. Corliss Skains. Pathology from the procedure revealed: tumor the size of 9 mm; histology of grade 3 invasive ductal carcinoma with high-grade DCIS; all margins negative for invasive and in situ carcinoma; nodal status of 2/2 left axillary sentinel lymph node excisions negative for metastatic carcinoma. Prognostic indicators significant for: estrogen receptor 0% negative; progesterone receptor 0% negative; Proliferation marker Ki67 at 80%; Her2 status negative; Grade 3.   Dr. Al Pimple initially recommended the patient to consider adjuvant chemotherapy consisting of CMF. However, upon bringing this up to the patient on several occasions, Dr. Al Pimple noted that the patient had trouble recalling their conversations surrounding systemic treatment. Given her lapses in memory and general frailty, Dr. Al Pimple recommends that she proceed with adjuvant radiation followed by observation.   On review of systems, the patient reports ***. She denies *** and any other symptoms.    Allergies:  is allergic to other.  Meds: Current Outpatient Medications  Medication Sig Dispense Refill   acetaminophen  (TYLENOL) 500 MG tablet Take 500 mg by mouth every 6 (six) hours as needed for moderate pain.     amLODipine (NORVASC) 2.5 MG tablet Take 2.5 mg by mouth daily.     ascorbic acid (VITAMIN C) 500 MG tablet Take 500 mg by mouth daily.     Calcium Carb-Cholecalciferol (CALCIUM 600+D) 600-10 MG-MCG TABS Take 1 tablet by mouth daily at 6 (six) AM.     CALCIUM PO Take 1 tablet by mouth daily.     carbamide peroxide (DEBROX) 6.5 % OTIC solution Place 5 drops into both ears 2 (two) times daily. (Patient not taking: Reported on 02/24/2023) 15 mL 0   Carboxymethylcellul-Glycerin (CLEAR EYES FOR DRY EYES OP) Place 1-2 drops into both eyes 2 (two) times daily as needed (for dryness).     cholecalciferol (VITAMIN D) 1000 units tablet Take 1,000 Units by mouth daily.     ferrous sulfate 325 (65 FE) MG tablet Take 325 mg by mouth daily with breakfast.     fluticasone (FLONASE) 50 MCG/ACT nasal spray Place 1-2 sprays into both nostrils daily as needed for allergies or rhinitis.     hydroxychloroquine (PLAQUENIL) 200 MG tablet Take 200 mg by mouth daily.  0   levocetirizine (XYZAL) 5 MG tablet Take 5 mg by mouth every evening.     mirtazapine (REMERON) 7.5 MG tablet Take 7.5 mg by mouth at bedtime.     omega-3 acid ethyl esters (LOVAZA) 1 g capsule Take 1 g by mouth daily.     predniSONE (DELTASONE) 5 MG tablet Take 5 mg by mouth daily as needed (For pain).     traMADol (ULTRAM) 50 MG tablet Take 1 tablet (  50 mg total) by mouth every 6 (six) hours as needed for moderate pain or severe pain. 30 tablet 0   No current facility-administered medications for this encounter.    Physical Findings: The patient is in no acute distress. Patient is alert and oriented.  vitals were not taken for this visit.  No significant changes. Lungs are clear to auscultation bilaterally. Heart has regular rate and rhythm. No palpable cervical, supraclavicular, or axillary adenopathy. Abdomen soft, non-tender, normal bowel sounds. Right  Breast: no palpable mass, nipple discharge or bleeding. Left Breast: ***  Lab Findings: Lab Results  Component Value Date   WBC 4.5 02/18/2023   HGB 12.4 02/18/2023   HCT 37.2 02/18/2023   MCV 98.7 02/18/2023   PLT 192 02/18/2023    Radiographic Findings: No results found.  Impression: Stage IB (cT1b, cN0, cM0)  Left Breast UOQ, Invasive and in situ ductal carcinoma, ER- / PR- / Her2-, Grade 3: s/p lumpectomy and SLN biopsies   ***  Plan:  Patient is scheduled for CT simulation {date/later today}. ***  -----------------------------------  Billie Lade, PhD, MD  This document serves as a record of services personally performed by Antony Blackbird, MD. It was created on his behalf by Neena Rhymes, a trained medical scribe. The creation of this record is based on the scribe's personal observations and the provider's statements to them. This document has been checked and approved by the attending provider.

## 2023-04-13 ENCOUNTER — Other Ambulatory Visit: Payer: Self-pay

## 2023-04-13 ENCOUNTER — Encounter: Payer: Self-pay | Admitting: Radiation Oncology

## 2023-04-13 ENCOUNTER — Ambulatory Visit
Admission: RE | Admit: 2023-04-13 | Discharge: 2023-04-13 | Disposition: A | Discharge status: Home / Self Care | Payer: Medicare PPO | Source: Ambulatory Visit | Attending: Radiation Oncology | Admitting: Radiation Oncology

## 2023-04-13 VITALS — BP 132/72 | HR 96 | Temp 97.5°F | Resp 20 | Ht 63.0 in | Wt 111.0 lb

## 2023-04-13 DIAGNOSIS — Z79899 Other long term (current) drug therapy: Secondary | ICD-10-CM | POA: Insufficient documentation

## 2023-04-13 DIAGNOSIS — C50412 Malignant neoplasm of upper-outer quadrant of left female breast: Secondary | ICD-10-CM | POA: Diagnosis present

## 2023-04-13 DIAGNOSIS — Z171 Estrogen receptor negative status [ER-]: Secondary | ICD-10-CM | POA: Insufficient documentation

## 2023-04-13 NOTE — Progress Notes (Signed)
Location of Breast Cancer: Malignant neoplasm of upper-outer quadrant of left breast in female, estrogen receptor negative   Histology per Pathology Report:  03-03-23 FINAL MICROSCOPIC DIAGNOSIS:  A. BREAST, LEFT, LUMPECTOMY:      Invasive ductal carcinoma, 0.9 cm, grade 3 Ductal carcinoma in situ:  Solid type, nuclear grade 3 Margins, invasive:  Negative           Closest, invasive:  Inferior (2 mm) Margins, DCIS:  Negative           Closest, DCIS: Anterior (5 mm) Lymphovascular invasion: Not identified Prognostic markers: ER: 0%, Negative PR: 0%, Negative. Her2: 0, Negative Ki-67 80% Other: Adenosis See oncology table  B. LYMPH NODE, LEFT AXILLARY, SENTINEL, EXCISION:      One lymph node, negative for metastatic carcinoma (0/1).  C. LYMPH NODE, LEFT AXILLARY, SENTINEL, EXCISION:      One lymph node, negative for metastatic carcinoma (0/1).  ONCOLOGY TABLE:  INVASIVE CARCINOMA OF THE BREAST:  Resection  Procedure: Lumpectomy Specimen Laterality: Left Histologic Type: Invasive ductal carcinoma Histologic Grade:      Glandular (Acinar)/Tubular Differentiation: 3      Nuclear Pleomorphism: 3      Mitotic Rate: 2      Overall Grade: 3 Tumor Size: 0.9 cm in maximal dimension Ductal Carcinoma In Situ: Solid type, nuclear grade 3 Lymphatic and/or Vascular Invasion:  Not identified Treatment Effect in the Breast: No known presurgical therapy Margins: All margins negative for invasive carcinoma      Distance from Closest Margin (mm): 2 mm      Specify Closest Margin (required only if <66mm): Inferior margin DCIS Margins: Uninvolved by DCIS      Distance from Closest Margin (mm): 5 mm from anterior margin      Specify Closest Margin (required only if <67mm): Anterior Regional Lymph Nodes:      Number of Lymph Nodes Examined: 2      Number of Sentinel Nodes Examined: 2      Number of Lymph Nodes with Macrometastases (>2 mm): 0      Number of Lymph Nodes with  Micrometastases: 0      Number of Lymph Nodes with Isolated Tumor Cells (=0.2 mm or =200 cells): 0      Size of Largest Metastatic Deposit (mm): NA      Extranodal Extension: NA Distant Metastasis:      Distant Site(s) Involved: [Not applicable] Breast Biomarker Testing Performed on Previous Biopsy:      Testing Performed on Case Number: [id number]            Estrogen Receptor: 0%, Negative            Progesterone Receptor: : 0%, Negative            HER2: 0, Negative            Ki-67: 80% Pathologic Stage Classification (pTNM, AJCC 8th Edition): pT1b, pN0 Representative Tumor Block: A1, A2 Comment(s): None (v4.5.0.0)   Receptor Status: ER(0%), PR (0%), Her2-neu (0%), Ki-67(80%)  Did patient present with symptoms (if so, please note symptoms) or was this found on screening mammography?: palpable mass  Past/Anticipated interventions by surgeon, if any: 03-03-23 Preop diagnosis: Invasive ductal carcinoma left breast - triple-negative Postop diagnosis: Same Procedure performed: Ultrasound guided right internal jugular vein port placement Left radioactive seed localized lumpectomy and sentinel lymph node biopsy Surgeon:Matthew K Tsuei  Past/Anticipated interventions by medical oncology, if any:  Oncology History  Malignant neoplasm of upper-outer quadrant of  left breast in female, estrogen receptor negative (HCC)  01/28/2023 Mammogram    Mammogram showed highly suspicious mass in the left breast at 1 0 clock measuring 8 mm. Targeted ultrasound performed in the left breast at 1 o'clock 10 cm from the nipple demonstrating an irregular hypoechoic mass measuring 0.7 x 0.6 x 0.8 cm. This corresponds to the mammographic finding.   Targeted ultrasound the left axilla demonstrates normal lymph nodes.    02/09/2023 Pathology Results    Pathology results showed grade 3 IDC, triple neg, Ki 67 80%    02/16/2023 Initial Diagnosis    Malignant neoplasm of upper-outer quadrant of left breast in  female, estrogen receptor negative (HCC)      Genetic Testing    [facility] Custom Panel+RNA was Negative. Report date is 03/02/2023.   The Custom Hereditary Cancers Panel offered by [facility] includes sequencing and/or deletion duplication testing of the following 43 genes: APC, ATM, AXIN2, BAP1, BARD1, BMPR1A, BRCA1, BRCA2, BRIP1, CDH1, CDK4, CDKN2A (p14ARF and p16INK4a only), CHEK2, CTNNA1, EPCAM (Deletion/duplication testing only), FH, GREM1 (promoter region duplication testing only), HOXB13, KIT, MBD4, MEN1, MLH1, MSH2, MSH3, MSH6, MUTYH, NF1, NHTL1, PALB2, PDGFRA, PMS2, POLD1, POLE, PTEN, RAD51C, RAD51D, SMAD4, SMARCA4. STK11, TP53, TSC1, TSC2, and VHL.   ASSESSMENT AND PLAN:  Malignant neoplasm of upper-outer quadrant of left breast in female, estrogen receptor negative (HCC) This is a very pleasant 72 yr old female patient with past medical history significant for hypertension, rheumatoid arthritis on hydroxychloroquine now diagnosed with left breast invasive ductal carcinoma, high-grade, triple negative referred to breast MDC for additional recommendations.  Since her last visit here she had lumpectomy which showed 0.9 cm grade 3 IDC, triple negative, negative margins, 2 sentinel lymph nodes negative Physical examination today deferred in lieu of counseling.  We have discussed that in triple negative cancers over 5 mm we generally do recommend adjuvant chemotherapy.  I have previously discussed this in detail.  She however has no memory of it, has no clear understanding of the need for adjuvant chemotherapy despite multiple repetitions.  She also appears overtly frail and Kept asking me why she needs to do chemotherapy if she had surgery and she did not need any pain medication.  Hence we have discussed that in case she does agree to adjuvant chemotherapy, we will try adjuvant CMF.  She will otherwise proceed with adjuvant radiation.  No role for antiestrogen therapy. Thank you for consulting [place] in  the care of this patient.  Please do not hesitate to contact us with any additional questions or concerns. I told her that we will call her back in 2 weeks to see if she wants to proceed with some form of chemotherapy.  She expressed understanding   Total time spent: 30 min All questions were answered. The patient knows to call the clinic with any problems, questions or concerns.    Rachel Moulds, MD 03/26/23       Lymphedema issues, if any:  no    Pain issues, if any:  no   SAFETY ISSUES: Prior radiation? no Pacemaker/ICD? no Possible current pregnancy?no Is the patient on methotrexate? no  Current Complaints / other details:      Vitals:   04/13/23 1327  BP: 132/72  Pulse: 96  Resp: 20  Temp: (!) 97.5 F (36.4 C)  SpO2: 100%  Weight: 50.3 kg  Height: 5\' 3"  (1.6 m)

## 2023-04-14 ENCOUNTER — Ambulatory Visit: Payer: Medicare PPO | Admitting: Radiation Oncology

## 2023-04-16 DIAGNOSIS — C50412 Malignant neoplasm of upper-outer quadrant of left female breast: Secondary | ICD-10-CM | POA: Diagnosis not present

## 2023-04-18 LAB — AMB RESULTS CONSOLE CBG: Glucose: 132

## 2023-04-18 NOTE — Progress Notes (Unsigned)
Pt has PCP and Food resources were given at the event

## 2023-04-20 ENCOUNTER — Other Ambulatory Visit: Payer: Self-pay

## 2023-04-20 ENCOUNTER — Ambulatory Visit
Admission: RE | Admit: 2023-04-20 | Discharge: 2023-04-20 | Disposition: A | Discharge status: Home / Self Care | Payer: Medicare PPO | Source: Ambulatory Visit | Attending: Radiation Oncology | Admitting: Radiation Oncology

## 2023-04-20 ENCOUNTER — Encounter: Payer: Self-pay | Admitting: *Deleted

## 2023-04-20 DIAGNOSIS — Z171 Estrogen receptor negative status [ER-]: Secondary | ICD-10-CM

## 2023-04-20 DIAGNOSIS — C50412 Malignant neoplasm of upper-outer quadrant of left female breast: Secondary | ICD-10-CM | POA: Diagnosis present

## 2023-04-20 LAB — RAD ONC ARIA SESSION SUMMARY
Course Elapsed Days: 0
Plan Fractions Treated to Date: 1
Plan Prescribed Dose Per Fraction: 2.67 Gy
Plan Total Fractions Prescribed: 15
Plan Total Prescribed Dose: 40.05 Gy
Reference Point Dosage Given to Date: 2.67 Gy
Reference Point Session Dosage Given: 2.67 Gy
Session Number: 1

## 2023-04-21 ENCOUNTER — Other Ambulatory Visit: Payer: Self-pay

## 2023-04-21 ENCOUNTER — Ambulatory Visit
Admission: RE | Admit: 2023-04-21 | Discharge: 2023-04-21 | Disposition: A | Discharge status: Home / Self Care | Payer: Medicare PPO | Source: Ambulatory Visit | Attending: Radiation Oncology | Admitting: Radiation Oncology

## 2023-04-21 DIAGNOSIS — C50412 Malignant neoplasm of upper-outer quadrant of left female breast: Secondary | ICD-10-CM | POA: Diagnosis not present

## 2023-04-21 LAB — RAD ONC ARIA SESSION SUMMARY
Course Elapsed Days: 1
Plan Fractions Treated to Date: 2
Plan Prescribed Dose Per Fraction: 2.67 Gy
Plan Total Fractions Prescribed: 15
Plan Total Prescribed Dose: 40.05 Gy
Reference Point Dosage Given to Date: 5.34 Gy
Reference Point Session Dosage Given: 2.67 Gy
Session Number: 2

## 2023-04-21 MED ORDER — ALRA NON-METALLIC DEODORANT (RAD-ONC)
1.0000 | Freq: Once | TOPICAL | Status: AC
  Administered 2023-04-21: 1 via TOPICAL

## 2023-04-21 MED ORDER — RADIAPLEXRX EX GEL: Freq: Once | CUTANEOUS | Status: AC

## 2023-04-22 ENCOUNTER — Other Ambulatory Visit: Payer: Self-pay

## 2023-04-22 ENCOUNTER — Ambulatory Visit
Admission: RE | Admit: 2023-04-22 | Discharge: 2023-04-22 | Disposition: A | Discharge status: Home / Self Care | Payer: Medicare PPO | Source: Ambulatory Visit | Attending: Radiation Oncology | Admitting: Radiation Oncology

## 2023-04-22 DIAGNOSIS — C50412 Malignant neoplasm of upper-outer quadrant of left female breast: Secondary | ICD-10-CM | POA: Diagnosis not present

## 2023-04-22 LAB — RAD ONC ARIA SESSION SUMMARY
Course Elapsed Days: 2
Plan Fractions Treated to Date: 3
Plan Prescribed Dose Per Fraction: 2.67 Gy
Plan Total Fractions Prescribed: 15
Plan Total Prescribed Dose: 40.05 Gy
Reference Point Dosage Given to Date: 8.01 Gy
Reference Point Session Dosage Given: 2.67 Gy
Session Number: 3

## 2023-04-24 ENCOUNTER — Other Ambulatory Visit: Payer: Self-pay

## 2023-04-24 ENCOUNTER — Ambulatory Visit
Admission: RE | Admit: 2023-04-24 | Discharge: 2023-04-24 | Disposition: A | Discharge status: Home / Self Care | Payer: Medicare PPO | Source: Ambulatory Visit | Attending: Radiation Oncology | Admitting: Radiation Oncology

## 2023-04-24 DIAGNOSIS — C50412 Malignant neoplasm of upper-outer quadrant of left female breast: Secondary | ICD-10-CM | POA: Diagnosis not present

## 2023-04-24 LAB — RAD ONC ARIA SESSION SUMMARY
Course Elapsed Days: 4
Plan Fractions Treated to Date: 4
Plan Prescribed Dose Per Fraction: 2.67 Gy
Plan Total Fractions Prescribed: 15
Plan Total Prescribed Dose: 40.05 Gy
Reference Point Dosage Given to Date: 10.68 Gy
Reference Point Session Dosage Given: 2.67 Gy
Session Number: 4

## 2023-04-27 ENCOUNTER — Ambulatory Visit
Admission: RE | Admit: 2023-04-27 | Discharge: 2023-04-27 | Disposition: A | Discharge status: Home / Self Care | Payer: Medicare PPO | Source: Ambulatory Visit | Attending: Radiation Oncology | Admitting: Radiation Oncology

## 2023-04-27 ENCOUNTER — Other Ambulatory Visit: Payer: Self-pay

## 2023-04-27 DIAGNOSIS — C50412 Malignant neoplasm of upper-outer quadrant of left female breast: Secondary | ICD-10-CM | POA: Diagnosis not present

## 2023-04-27 LAB — RAD ONC ARIA SESSION SUMMARY
Course Elapsed Days: 7
Plan Fractions Treated to Date: 5
Plan Prescribed Dose Per Fraction: 2.67 Gy
Plan Total Fractions Prescribed: 15
Plan Total Prescribed Dose: 40.05 Gy
Reference Point Dosage Given to Date: 13.35 Gy
Reference Point Session Dosage Given: 2.67 Gy
Session Number: 5

## 2023-04-28 ENCOUNTER — Ambulatory Visit
Admission: RE | Admit: 2023-04-28 | Discharge: 2023-04-28 | Disposition: A | Discharge status: Home / Self Care | Payer: Medicare PPO | Source: Ambulatory Visit | Attending: Radiation Oncology | Admitting: Radiation Oncology

## 2023-04-28 ENCOUNTER — Other Ambulatory Visit: Payer: Self-pay

## 2023-04-28 DIAGNOSIS — C50412 Malignant neoplasm of upper-outer quadrant of left female breast: Secondary | ICD-10-CM | POA: Diagnosis not present

## 2023-04-28 LAB — RAD ONC ARIA SESSION SUMMARY
Course Elapsed Days: 8
Plan Fractions Treated to Date: 6
Plan Prescribed Dose Per Fraction: 2.67 Gy
Plan Total Fractions Prescribed: 15
Plan Total Prescribed Dose: 40.05 Gy
Reference Point Dosage Given to Date: 16.02 Gy
Reference Point Session Dosage Given: 2.67 Gy
Session Number: 6

## 2023-04-29 ENCOUNTER — Other Ambulatory Visit: Payer: Self-pay

## 2023-04-29 ENCOUNTER — Ambulatory Visit
Admission: RE | Admit: 2023-04-29 | Discharge: 2023-04-29 | Disposition: A | Discharge status: Home / Self Care | Payer: Medicare PPO | Source: Ambulatory Visit | Attending: Radiation Oncology | Admitting: Radiation Oncology

## 2023-04-29 DIAGNOSIS — C50412 Malignant neoplasm of upper-outer quadrant of left female breast: Secondary | ICD-10-CM | POA: Diagnosis not present

## 2023-04-29 LAB — RAD ONC ARIA SESSION SUMMARY
Course Elapsed Days: 9
Plan Fractions Treated to Date: 7
Plan Prescribed Dose Per Fraction: 2.67 Gy
Plan Total Fractions Prescribed: 15
Plan Total Prescribed Dose: 40.05 Gy
Reference Point Dosage Given to Date: 18.69 Gy
Reference Point Session Dosage Given: 2.67 Gy
Session Number: 7

## 2023-04-30 ENCOUNTER — Ambulatory Visit
Admission: RE | Admit: 2023-04-30 | Discharge: 2023-04-30 | Disposition: A | Discharge status: Home / Self Care | Payer: Medicare PPO | Source: Ambulatory Visit | Attending: Radiation Oncology | Admitting: Radiation Oncology

## 2023-04-30 ENCOUNTER — Other Ambulatory Visit: Payer: Self-pay

## 2023-04-30 DIAGNOSIS — C50412 Malignant neoplasm of upper-outer quadrant of left female breast: Secondary | ICD-10-CM | POA: Diagnosis not present

## 2023-04-30 LAB — RAD ONC ARIA SESSION SUMMARY
Course Elapsed Days: 10
Plan Fractions Treated to Date: 8
Plan Prescribed Dose Per Fraction: 2.67 Gy
Plan Total Fractions Prescribed: 15
Plan Total Prescribed Dose: 40.05 Gy
Reference Point Dosage Given to Date: 21.36 Gy
Reference Point Session Dosage Given: 2.67 Gy
Session Number: 8

## 2023-05-01 ENCOUNTER — Other Ambulatory Visit: Payer: Self-pay

## 2023-05-01 ENCOUNTER — Ambulatory Visit
Admission: RE | Admit: 2023-05-01 | Discharge: 2023-05-01 | Disposition: A | Discharge status: Home / Self Care | Payer: Medicare PPO | Source: Ambulatory Visit | Attending: Radiation Oncology | Admitting: Radiation Oncology

## 2023-05-01 DIAGNOSIS — C50412 Malignant neoplasm of upper-outer quadrant of left female breast: Secondary | ICD-10-CM | POA: Diagnosis not present

## 2023-05-01 LAB — RAD ONC ARIA SESSION SUMMARY
Course Elapsed Days: 11
Plan Fractions Treated to Date: 9
Plan Prescribed Dose Per Fraction: 2.67 Gy
Plan Total Fractions Prescribed: 15
Plan Total Prescribed Dose: 40.05 Gy
Reference Point Dosage Given to Date: 24.03 Gy
Reference Point Session Dosage Given: 2.67 Gy
Session Number: 9

## 2023-05-04 ENCOUNTER — Other Ambulatory Visit: Payer: Self-pay

## 2023-05-04 ENCOUNTER — Ambulatory Visit: Admission: RE | Admit: 2023-05-04 | Payer: Medicare PPO | Source: Ambulatory Visit

## 2023-05-04 DIAGNOSIS — C50412 Malignant neoplasm of upper-outer quadrant of left female breast: Secondary | ICD-10-CM | POA: Diagnosis not present

## 2023-05-04 LAB — RAD ONC ARIA SESSION SUMMARY
Course Elapsed Days: 14
Plan Fractions Treated to Date: 10
Plan Prescribed Dose Per Fraction: 2.67 Gy
Plan Total Fractions Prescribed: 15
Plan Total Prescribed Dose: 40.05 Gy
Reference Point Dosage Given to Date: 26.7 Gy
Reference Point Session Dosage Given: 2.67 Gy
Session Number: 10

## 2023-05-05 ENCOUNTER — Ambulatory Visit
Admission: RE | Admit: 2023-05-05 | Discharge: 2023-05-05 | Disposition: A | Discharge status: Home / Self Care | Payer: Medicare PPO | Source: Ambulatory Visit | Attending: Radiation Oncology | Admitting: Radiation Oncology

## 2023-05-05 ENCOUNTER — Other Ambulatory Visit: Payer: Self-pay

## 2023-05-05 DIAGNOSIS — C50412 Malignant neoplasm of upper-outer quadrant of left female breast: Secondary | ICD-10-CM | POA: Diagnosis not present

## 2023-05-05 LAB — RAD ONC ARIA SESSION SUMMARY
Course Elapsed Days: 15
Plan Fractions Treated to Date: 11
Plan Prescribed Dose Per Fraction: 2.67 Gy
Plan Total Fractions Prescribed: 15
Plan Total Prescribed Dose: 40.05 Gy
Reference Point Dosage Given to Date: 29.37 Gy
Reference Point Session Dosage Given: 2.67 Gy
Session Number: 11

## 2023-05-06 ENCOUNTER — Ambulatory Visit
Admission: RE | Admit: 2023-05-06 | Discharge: 2023-05-06 | Disposition: A | Discharge status: Home / Self Care | Payer: Medicare PPO | Source: Ambulatory Visit | Attending: Radiation Oncology | Admitting: Radiation Oncology

## 2023-05-06 ENCOUNTER — Other Ambulatory Visit: Payer: Self-pay

## 2023-05-06 DIAGNOSIS — C50412 Malignant neoplasm of upper-outer quadrant of left female breast: Secondary | ICD-10-CM | POA: Diagnosis not present

## 2023-05-06 LAB — RAD ONC ARIA SESSION SUMMARY
Course Elapsed Days: 16
Plan Fractions Treated to Date: 12
Plan Prescribed Dose Per Fraction: 2.67 Gy
Plan Total Fractions Prescribed: 15
Plan Total Prescribed Dose: 40.05 Gy
Reference Point Dosage Given to Date: 32.04 Gy
Reference Point Session Dosage Given: 2.67 Gy
Session Number: 12

## 2023-05-07 ENCOUNTER — Ambulatory Visit
Admission: RE | Admit: 2023-05-07 | Discharge: 2023-05-07 | Disposition: A | Discharge status: Home / Self Care | Payer: Medicare PPO | Source: Ambulatory Visit | Attending: Radiation Oncology | Admitting: Radiation Oncology

## 2023-05-07 ENCOUNTER — Other Ambulatory Visit: Payer: Self-pay

## 2023-05-07 DIAGNOSIS — C50412 Malignant neoplasm of upper-outer quadrant of left female breast: Secondary | ICD-10-CM | POA: Diagnosis not present

## 2023-05-07 LAB — RAD ONC ARIA SESSION SUMMARY
Course Elapsed Days: 17
Plan Fractions Treated to Date: 13
Plan Prescribed Dose Per Fraction: 2.67 Gy
Plan Total Fractions Prescribed: 15
Plan Total Prescribed Dose: 40.05 Gy
Reference Point Dosage Given to Date: 34.71 Gy
Reference Point Session Dosage Given: 2.67 Gy
Session Number: 13

## 2023-05-08 ENCOUNTER — Ambulatory Visit: Payer: Medicare PPO

## 2023-05-08 NOTE — Progress Notes (Signed)
Pt attended screening event on 04/18/23, where screening result was Glucose wnl. At the event, Pt confirmed Loura Back, NP, as PCP, at [facility] and indicated food insecurities. Pt was provided with food resources at the event. Per chart review, Pt has ongoing oncology visit and upcomming appointment on 07/09/23. Visit to PCP is not visible due to [facility] not being in [facility].

## 2023-05-11 ENCOUNTER — Other Ambulatory Visit: Payer: Self-pay

## 2023-05-11 ENCOUNTER — Ambulatory Visit
Admission: RE | Admit: 2023-05-11 | Discharge: 2023-05-11 | Disposition: A | Discharge status: Home / Self Care | Payer: Medicare PPO | Source: Ambulatory Visit | Attending: Radiation Oncology | Admitting: Radiation Oncology

## 2023-05-11 DIAGNOSIS — C50412 Malignant neoplasm of upper-outer quadrant of left female breast: Secondary | ICD-10-CM | POA: Diagnosis not present

## 2023-05-11 LAB — RAD ONC ARIA SESSION SUMMARY
Course Elapsed Days: 21
Plan Fractions Treated to Date: 14
Plan Prescribed Dose Per Fraction: 2.67 Gy
Plan Total Fractions Prescribed: 15
Plan Total Prescribed Dose: 40.05 Gy
Reference Point Dosage Given to Date: 37.38 Gy
Reference Point Session Dosage Given: 2.67 Gy
Session Number: 14

## 2023-05-12 ENCOUNTER — Other Ambulatory Visit: Payer: Self-pay

## 2023-05-12 ENCOUNTER — Ambulatory Visit: Payer: Medicare PPO

## 2023-05-12 DIAGNOSIS — C50412 Malignant neoplasm of upper-outer quadrant of left female breast: Secondary | ICD-10-CM | POA: Diagnosis not present

## 2023-05-12 LAB — RAD ONC ARIA SESSION SUMMARY
Course Elapsed Days: 22
Plan Fractions Treated to Date: 15
Plan Prescribed Dose Per Fraction: 2.67 Gy
Plan Total Fractions Prescribed: 15
Plan Total Prescribed Dose: 40.05 Gy
Reference Point Dosage Given to Date: 40.05 Gy
Reference Point Session Dosage Given: 2.67 Gy
Session Number: 15

## 2023-05-13 ENCOUNTER — Other Ambulatory Visit: Payer: Self-pay

## 2023-05-13 ENCOUNTER — Ambulatory Visit
Admission: RE | Admit: 2023-05-13 | Discharge: 2023-05-13 | Disposition: A | Discharge status: Home / Self Care | Payer: Medicare PPO | Source: Ambulatory Visit | Attending: Radiation Oncology | Admitting: Radiation Oncology

## 2023-05-13 DIAGNOSIS — C50412 Malignant neoplasm of upper-outer quadrant of left female breast: Secondary | ICD-10-CM | POA: Diagnosis not present

## 2023-05-13 LAB — RAD ONC ARIA SESSION SUMMARY
Course Elapsed Days: 23
Plan Fractions Treated to Date: 1
Plan Prescribed Dose Per Fraction: 2 Gy
Plan Total Fractions Prescribed: 6
Plan Total Prescribed Dose: 12 Gy
Reference Point Dosage Given to Date: 2 Gy
Reference Point Session Dosage Given: 2 Gy
Session Number: 16

## 2023-05-14 ENCOUNTER — Ambulatory Visit
Admission: RE | Admit: 2023-05-14 | Discharge: 2023-05-14 | Disposition: A | Discharge status: Home / Self Care | Payer: Medicare PPO | Source: Ambulatory Visit | Attending: Radiation Oncology | Admitting: Radiation Oncology

## 2023-05-14 ENCOUNTER — Other Ambulatory Visit: Payer: Self-pay

## 2023-05-14 DIAGNOSIS — C50412 Malignant neoplasm of upper-outer quadrant of left female breast: Secondary | ICD-10-CM | POA: Diagnosis not present

## 2023-05-14 LAB — RAD ONC ARIA SESSION SUMMARY
Course Elapsed Days: 24
Plan Fractions Treated to Date: 2
Plan Prescribed Dose Per Fraction: 2 Gy
Plan Total Fractions Prescribed: 6
Plan Total Prescribed Dose: 12 Gy
Reference Point Dosage Given to Date: 4 Gy
Reference Point Session Dosage Given: 2 Gy
Session Number: 17

## 2023-05-15 ENCOUNTER — Other Ambulatory Visit: Payer: Self-pay

## 2023-05-15 ENCOUNTER — Ambulatory Visit
Admission: RE | Admit: 2023-05-15 | Discharge: 2023-05-15 | Disposition: A | Discharge status: Home / Self Care | Payer: Medicare PPO | Source: Ambulatory Visit | Attending: Radiation Oncology | Admitting: Radiation Oncology

## 2023-05-15 DIAGNOSIS — C50412 Malignant neoplasm of upper-outer quadrant of left female breast: Secondary | ICD-10-CM | POA: Diagnosis not present

## 2023-05-15 LAB — RAD ONC ARIA SESSION SUMMARY
Course Elapsed Days: 25
Plan Fractions Treated to Date: 3
Plan Prescribed Dose Per Fraction: 2 Gy
Plan Total Fractions Prescribed: 6
Plan Total Prescribed Dose: 12 Gy
Reference Point Dosage Given to Date: 6 Gy
Reference Point Session Dosage Given: 2 Gy
Session Number: 18

## 2023-05-18 ENCOUNTER — Ambulatory Visit: Admission: RE | Admit: 2023-05-18 | Payer: Medicare PPO | Source: Ambulatory Visit

## 2023-05-18 ENCOUNTER — Other Ambulatory Visit: Payer: Self-pay

## 2023-05-18 DIAGNOSIS — C50412 Malignant neoplasm of upper-outer quadrant of left female breast: Secondary | ICD-10-CM | POA: Diagnosis not present

## 2023-05-18 LAB — RAD ONC ARIA SESSION SUMMARY
Course Elapsed Days: 28
Plan Fractions Treated to Date: 4
Plan Prescribed Dose Per Fraction: 2 Gy
Plan Total Fractions Prescribed: 6
Plan Total Prescribed Dose: 12 Gy
Reference Point Dosage Given to Date: 8 Gy
Reference Point Session Dosage Given: 2 Gy
Session Number: 19

## 2023-05-19 ENCOUNTER — Ambulatory Visit: Admission: RE | Admit: 2023-05-19 | Payer: Medicare PPO | Source: Ambulatory Visit

## 2023-05-19 ENCOUNTER — Other Ambulatory Visit: Payer: Self-pay

## 2023-05-19 ENCOUNTER — Ambulatory Visit: Payer: Medicare PPO

## 2023-05-19 ENCOUNTER — Ambulatory Visit
Admission: RE | Admit: 2023-05-19 | Discharge: 2023-05-19 | Disposition: A | Discharge status: Home / Self Care | Payer: Medicare PPO | Source: Ambulatory Visit | Attending: Radiation Oncology | Admitting: Radiation Oncology

## 2023-05-19 DIAGNOSIS — C50412 Malignant neoplasm of upper-outer quadrant of left female breast: Secondary | ICD-10-CM | POA: Diagnosis not present

## 2023-05-19 LAB — RAD ONC ARIA SESSION SUMMARY
Course Elapsed Days: 29
Plan Fractions Treated to Date: 5
Plan Prescribed Dose Per Fraction: 2 Gy
Plan Total Fractions Prescribed: 6
Plan Total Prescribed Dose: 12 Gy
Reference Point Dosage Given to Date: 10 Gy
Reference Point Session Dosage Given: 2 Gy
Session Number: 20

## 2023-05-20 ENCOUNTER — Other Ambulatory Visit: Payer: Self-pay

## 2023-05-20 ENCOUNTER — Ambulatory Visit: Admission: RE | Admit: 2023-05-20 | Payer: Medicare PPO | Source: Ambulatory Visit

## 2023-05-20 DIAGNOSIS — C50412 Malignant neoplasm of upper-outer quadrant of left female breast: Secondary | ICD-10-CM | POA: Diagnosis not present

## 2023-05-20 LAB — RAD ONC ARIA SESSION SUMMARY
Course Elapsed Days: 30
Plan Fractions Treated to Date: 6
Plan Prescribed Dose Per Fraction: 2 Gy
Plan Total Fractions Prescribed: 6
Plan Total Prescribed Dose: 12 Gy
Reference Point Dosage Given to Date: 12 Gy
Reference Point Session Dosage Given: 2 Gy
Session Number: 21

## 2023-05-22 NOTE — Radiation Completion Notes (Signed)
Patient Name: Heather Roberson, Heather Roberson MRN: [id number] Date of Birth: 1951/09/02 Referring Physician: Loura Back, M.D. Date of Service: 2023-05-22 Radiation Oncologist: Arnette Schaumann, M.D. [facility] -                              RADIATION ONCOLOGY END OF TREATMENT NOTE     Diagnosis: C50.412 Malignant neoplasm of upper-outer quadrant of left female breast Staging on 2023-02-18: Malignant neoplasm of upper-outer quadrant of left breast in female, estrogen receptor negative (HCC) T=cT1b, N=cN0, M=cM0 Intent: Curative     ==========DELIVERED PLANS==========  First Treatment Date: 2023-04-20 - Last Treatment Date: 2023-05-20   Plan Name: Breast_L_BH Site: Breast, Left Technique: 3D Mode: Photon Dose Per Fraction: 2.67 Gy Prescribed Dose (Delivered / Prescribed): 40.05 Gy / 40.05 Gy Prescribed Fxs (Delivered / Prescribed): 15 / 15   Plan Name: Brst_L_Bst_BH Site: Breast, Left Technique: 3D Mode: Photon Dose Per Fraction: 2 Gy Prescribed Dose (Delivered / Prescribed): 12 Gy / 12 Gy Prescribed Fxs (Delivered / Prescribed): 6 / 6     ==========ON TREATMENT VISIT DATES========== 2023-04-21, 2023-04-28, 2023-05-05, 2023-05-19     ==========UPCOMING VISITS==========       ==========APPENDIX - ON TREATMENT VISIT NOTES==========   See weekly On Treatment Notes in Epic for details.

## 2023-06-01 ENCOUNTER — Encounter: Payer: Self-pay | Admitting: Genetic Counselor

## 2023-06-01 ENCOUNTER — Ambulatory Visit: Payer: Self-pay | Admitting: Genetic Counselor

## 2023-06-01 DIAGNOSIS — Z1379 Encounter for other screening for genetic and chromosomal anomalies: Secondary | ICD-10-CM

## 2023-06-01 NOTE — Progress Notes (Signed)
Error

## 2023-06-08 ENCOUNTER — Ambulatory Visit: Payer: Medicare PPO | Attending: Surgery

## 2023-06-08 DIAGNOSIS — R293 Abnormal posture: Secondary | ICD-10-CM | POA: Insufficient documentation

## 2023-06-08 DIAGNOSIS — C50412 Malignant neoplasm of upper-outer quadrant of left female breast: Secondary | ICD-10-CM | POA: Insufficient documentation

## 2023-06-08 DIAGNOSIS — Z171 Estrogen receptor negative status [ER-]: Secondary | ICD-10-CM | POA: Insufficient documentation

## 2023-06-11 ENCOUNTER — Ambulatory Visit: Payer: Self-pay | Admitting: Radiation Oncology

## 2023-06-17 ENCOUNTER — Encounter: Payer: Self-pay | Admitting: Radiation Oncology

## 2023-06-17 NOTE — Progress Notes (Signed)
  Radiation Oncology         [phone] ________________________________  Name: Heather Roberson MRN: [id number]  Date: 06/18/2023  DOB: [birth year]  End of Treatment Note  Diagnosis: Stage IB (cT1b, cN0, cM0)  Left Breast UOQ, Invasive and in situ ductal carcinoma, ER- / PR- / Her2-, Grade 3: s/p lumpectomy and SLN biopsies      Indication for treatment: Curative        Radiation treatment dates: 04/20/23 through 05/20/23   Site/dose:  1) Left breast - 40.05 Gy delivered in 15 Fx at 2.67 Gy/Fx 2) Left breast boost - 12 Gy delivered in 6 Fx at 2 Gy/Fx  Beams/energy:  6X-FFF / 10X, 6X  Narrative: The patient tolerated radiation treatment relatively well. During her final weekly treatment check on 05/19/23, the patient endorsed hyperpigmentation changes but denied any other symptoms. Physical exam performed on that same date showed significant hyperpigmentation changes in the left breast area without any skin breakdown.  Plan: The patient has completed radiation treatment. The patient will return to radiation oncology clinic for routine followup in one month. I advised them to call or return sooner if they have any questions or concerns related to their recovery or treatment.  -----------------------------------  Billie Lade, PhD, MD  This document serves as a record of services personally performed by Antony Blackbird, MD. It was created on his behalf by Neena Rhymes, a trained medical scribe. The creation of this record is based on the scribe's personal observations and the provider's statements to them. This document has been checked and approved by the attending provider.

## 2023-06-17 NOTE — Progress Notes (Signed)
Radiation Oncology         [phone] ________________________________  Name: Heather Roberson MRN: [id number]  Date: 06/18/2023  DOB: [birth year]  Follow-Up Visit Note  CC: Loura Back, NP  Loura Back, NP  No diagnosis found.  Diagnosis: Stage IB (cT1b, cN0, cM0)  Left Breast UOQ, Invasive and in situ ductal carcinoma, ER- / PR- / Her2-, Grade 3: s/p lumpectomy and SLN biopsies    Interval Since Last Radiation: about 1 month   Indication for treatment: Curative       Radiation treatment dates: 04/20/23 through 05/20/23  Site/dose:  1) Left breast - 40.05 Gy delivered in 15 Fx at 2.67 Gy/Fx 2) Left breast boost - 12 Gy delivered in 6 Fx at 2 Gy/Fx Beams/energy:  6X-FFF / 10X, 6X  Narrative:  The patient returns today for routine follow-up. The patient tolerated radiation treatment relatively well. During her final weekly treatment check on 05/19/23, the patient endorsed hyperpigmentation changes but denied any other symptoms. Physical exam performed on that same date showed significant hyperpigmentation changes in the left breast area without any skin breakdown.     No other significant interval history since the patient completed radiation therapy or since the patient was seen for her initial consultation this past June.   ***                           Allergies:  is allergic to other.  Meds: Current Outpatient Medications  Medication Sig Dispense Refill   acetaminophen (TYLENOL) 500 MG tablet Take 500 mg by mouth every 6 (six) hours as needed for moderate pain.     amLODipine (NORVASC) 2.5 MG tablet Take 2.5 mg by mouth daily.     ascorbic acid (VITAMIN C) 500 MG tablet Take 500 mg by mouth daily.     Calcium Carb-Cholecalciferol (CALCIUM 600+D) 600-10 MG-MCG TABS Take 1 tablet by mouth daily at 6 (six) AM.     CALCIUM PO Take 1 tablet by mouth daily.     carbamide peroxide (DEBROX) 6.5 % OTIC solution Place 5 drops into both ears 2 (two) times daily. 15 mL 0    Carboxymethylcellul-Glycerin (CLEAR EYES FOR DRY EYES OP) Place 1-2 drops into both eyes 2 (two) times daily as needed (for dryness).     cholecalciferol (VITAMIN D) 1000 units tablet Take 1,000 Units by mouth daily.     ferrous sulfate 325 (65 FE) MG tablet Take 325 mg by mouth daily with breakfast.     fluticasone (FLONASE) 50 MCG/ACT nasal spray Place 1-2 sprays into both nostrils daily as needed for allergies or rhinitis.     hydroxychloroquine (PLAQUENIL) 200 MG tablet Take 200 mg by mouth daily.  0   levocetirizine (XYZAL) 5 MG tablet Take 5 mg by mouth every evening.     mirtazapine (REMERON) 7.5 MG tablet Take 7.5 mg by mouth at bedtime.     omega-3 acid ethyl esters (LOVAZA) 1 g capsule Take 1 g by mouth daily.     predniSONE (DELTASONE) 5 MG tablet Take 5 mg by mouth daily as needed (For pain). (Patient not taking: Reported on 04/13/2023)     traMADol (ULTRAM) 50 MG tablet Take 1 tablet (50 mg total) by mouth every 6 (six) hours as needed for moderate pain or severe pain. (Patient not taking: Reported on 04/13/2023) 30 tablet 0   No current facility-administered medications for this encounter.    Physical Findings: The patient  is in no acute distress. Patient is alert and oriented.  vitals were not taken for this visit. .  No significant changes. Lungs are clear to auscultation bilaterally. Heart has regular rate and rhythm. No palpable cervical, supraclavicular, or axillary adenopathy. Abdomen soft, non-tender, normal bowel sounds.  Right Breast: no palpable mass, nipple discharge or bleeding. Left Breast: ***  Lab Findings: Lab Results  Component Value Date   WBC 4.5 02/18/2023   HGB 12.4 02/18/2023   HCT 37.2 02/18/2023   MCV 98.7 02/18/2023   PLT 192 02/18/2023    Radiographic Findings: No results found.  Impression:  Stage IB (cT1b, cN0, cM0)  Left Breast UOQ, Invasive and in situ ductal carcinoma, ER- / PR- / Her2-, Grade 3: s/p lumpectomy and SLN biopsies    The  patient is recovering from the effects of radiation.  ***  Plan:  ***   *** minutes of total time was spent for this patient encounter, including preparation, face-to-face counseling with the patient and coordination of care, physical exam, and documentation of the encounter. ____________________________________  Billie Lade, PhD, MD  This document serves as a record of services personally performed by Antony Blackbird, MD. It was created on his behalf by Neena Rhymes, a trained medical scribe. The creation of this record is based on the scribe's personal observations and the provider's statements to them. This document has been checked and approved by the attending provider.

## 2023-06-18 ENCOUNTER — Encounter: Payer: Self-pay | Admitting: Radiation Oncology

## 2023-06-18 ENCOUNTER — Telehealth: Payer: Self-pay | Admitting: *Deleted

## 2023-06-18 ENCOUNTER — Ambulatory Visit
Admission: RE | Admit: 2023-06-18 | Discharge: 2023-06-18 | Disposition: A | Discharge status: Home / Self Care | Payer: Medicare PPO | Source: Ambulatory Visit | Attending: Radiation Oncology | Admitting: Radiation Oncology

## 2023-06-18 VITALS — BP 129/92 | HR 79 | Temp 97.5°F | Resp 18 | Ht 63.0 in | Wt 104.2 lb

## 2023-06-18 DIAGNOSIS — Z171 Estrogen receptor negative status [ER-]: Secondary | ICD-10-CM | POA: Diagnosis not present

## 2023-06-18 DIAGNOSIS — C50412 Malignant neoplasm of upper-outer quadrant of left female breast: Secondary | ICD-10-CM | POA: Insufficient documentation

## 2023-06-18 DIAGNOSIS — R634 Abnormal weight loss: Secondary | ICD-10-CM

## 2023-06-18 DIAGNOSIS — Z923 Personal history of irradiation: Secondary | ICD-10-CM | POA: Insufficient documentation

## 2023-06-18 HISTORY — DX: Personal history of irradiation: Z92.3

## 2023-06-18 NOTE — Progress Notes (Signed)
Heather Roberson is here today for follow up post radiation to the breast.   Breast Side:Left   They completed their radiation on: 05/20/23  Does the patient complain of any of the following: Post radiation skin issues:  Improving Breast Tenderness: No Breast Swelling: No Lymphadema: No Range of Motion limitations: No Fatigue post radiation: Yes Appetite good/fair/poor: Poor, patient reports drinking ensures as needed. Patient reports she is no longer taking Remeron to stimulate appetite.  Weight: Wt Readings from Last 3 Encounters:  06/18/23 104 lb 4 oz (47.3 kg)  04/13/23 111 lb (50.3 kg)  03/26/23 110 lb 11.2 oz (50.2 kg)     Additional comments if applicable: Patient questioning when she will have her port a cath removed. To follow up with medical oncology.   BP (!) 129/92 (BP Location: Right Arm, Patient Position: Sitting)   Pulse 79   Temp (!) 97.5 F (36.4 C) (Temporal)   Resp 18   Ht 5\' 3"  (1.6 m)   Wt 104 lb 4 oz (47.3 kg)   SpO2 100%   BMI 18.47 kg/m

## 2023-06-18 NOTE — Telephone Encounter (Signed)
Called patient to inform of nutrition appt.for 06-23-23 @ 2:30 pm with Zenovia Jarred, spoke with patient and she is aware of this appt.

## 2023-06-23 ENCOUNTER — Encounter: Payer: Medicare PPO | Admitting: Nutrition

## 2023-06-29 ENCOUNTER — Inpatient Hospital Stay: Payer: Medicare PPO | Attending: Hematology and Oncology | Admitting: Nutrition

## 2023-06-29 ENCOUNTER — Other Ambulatory Visit: Payer: Self-pay

## 2023-06-29 DIAGNOSIS — Z923 Personal history of irradiation: Secondary | ICD-10-CM | POA: Insufficient documentation

## 2023-06-29 DIAGNOSIS — R634 Abnormal weight loss: Secondary | ICD-10-CM | POA: Insufficient documentation

## 2023-06-29 DIAGNOSIS — Z79899 Other long term (current) drug therapy: Secondary | ICD-10-CM | POA: Insufficient documentation

## 2023-06-29 DIAGNOSIS — C50412 Malignant neoplasm of upper-outer quadrant of left female breast: Secondary | ICD-10-CM | POA: Insufficient documentation

## 2023-06-29 DIAGNOSIS — Z171 Estrogen receptor negative status [ER-]: Secondary | ICD-10-CM | POA: Insufficient documentation

## 2023-06-29 NOTE — Progress Notes (Signed)
72 year old female diagnosed with breast cancer status post radiation therapy.  She is followed by Dr. Roselind Messier.  Past medical history includes hypertension,  Medications include vitamin C, calcium, ferrous sulfate, Remeron, and prednisone.  Labs were reviewed.  Height: 5 feet 3 inches. Weight: 106 pounds 3.2 ounces September 9. Usual body weight: 110-115 pounds per patient. Patient weighed 114 pounds in May 2024. BMI: 18.81.  Patient referred by MD for decreased appetite and weight loss.  She reports that Remeron did not agree with her.  She has a home to take care of and that sometimes causes her to have increased stress.  She eats better when she is with her family.  She enjoys the foods her family provides.  She likes Ensure products but has difficulty affording them.  Endorses poor appetite.  Nutrition diagnosis: Unintended weight loss related to cancer and associated treatments as evidenced by 8% weight loss over 3 months which is clinically significant.  Intervention: Educated to consume smaller more frequent meals and snacks with higher calorie, higher protein foods. Reviewed options for adding calories and provided examples for snacks.  Provided nutrition facts sheets. Encourage patient to increase Ensure Plus or equivalent twice daily.  Provided coupons and samples.  Encouraged her to try generic Ensure Plus as it is often less expensive.  We also discussed making smoothies and she is open to this. Questions were answered.  Contact information provided.  Monitoring, evaluation, goals: Patient will tolerate increased calories and protein to minimize weight loss.  No follow-up scheduled as patient is out of treatment.  She has my contact information for questions.  **Disclaimer: This note was dictated with voice recognition software. Similar sounding words can inadvertently be transcribed and this note may contain transcription errors which may not have been corrected upon  publication of note.**

## 2023-07-03 ENCOUNTER — Telehealth: Payer: Self-pay

## 2023-07-03 NOTE — Telephone Encounter (Signed)
Pt LVM with questions regarding her port. Pt wanted information on when she can get her port removed, who would be removing the port, and inquiring on why the port has not been taken out yet. This RN explained to the patient that it varies from patient to patient on port removal and that would be something that she would be able to discuss at her appointment with Lillard Anes, NP. Pt reminded that her appointment is on 07/09/23 at 12:15pm at [facility]. Pt verbalized understanding.

## 2023-07-07 ENCOUNTER — Encounter: Payer: Self-pay | Admitting: *Deleted

## 2023-07-07 NOTE — Progress Notes (Signed)
Pt attended 04/18/2023 screening event where her blood sugar was 131. At the event, the pt confirmed her PCP was NP Melynda Ripple at [facility] and she identified food SDOH needs and was given resources at the event. During this 60 day event f/u call with pt, the pt noted she still sees Loura Back as her PCP and has spent most of her recent time with radiation therapy and has been connected to a nutritionist through the [facility], so is getting food from the community resources and Ensure from the cancer center. Pt states her daughter is also helping her get Ensure samples. Pt stated she appreciated the follow up and did not have any additional needs at this time, is living with her daughter until she can get her strength back. No additional health equity team support indicated at this time.

## 2023-07-09 ENCOUNTER — Encounter: Payer: Self-pay | Admitting: Adult Health

## 2023-07-09 ENCOUNTER — Inpatient Hospital Stay (HOSPITAL_BASED_OUTPATIENT_CLINIC_OR_DEPARTMENT_OTHER): Payer: Medicare PPO | Admitting: Adult Health

## 2023-07-09 VITALS — BP 123/88 | HR 100 | Temp 98.2°F | Resp 16 | Ht 63.0 in | Wt 105.7 lb

## 2023-07-09 DIAGNOSIS — Z171 Estrogen receptor negative status [ER-]: Secondary | ICD-10-CM

## 2023-07-09 DIAGNOSIS — C50412 Malignant neoplasm of upper-outer quadrant of left female breast: Secondary | ICD-10-CM

## 2023-07-09 DIAGNOSIS — Z923 Personal history of irradiation: Secondary | ICD-10-CM | POA: Diagnosis not present

## 2023-07-09 DIAGNOSIS — Z79899 Other long term (current) drug therapy: Secondary | ICD-10-CM | POA: Diagnosis not present

## 2023-07-09 DIAGNOSIS — R634 Abnormal weight loss: Secondary | ICD-10-CM | POA: Diagnosis not present

## 2023-07-09 NOTE — Progress Notes (Signed)
SURVIVORSHIP VISIT:  BRIEF ONCOLOGIC HISTORY:  Oncology History  Malignant neoplasm of upper-outer quadrant of left breast in female, estrogen receptor negative (HCC)  01/28/2023 Mammogram   Mammogram showed highly suspicious mass in the left breast at 1 0 clock measuring 8 mm. Targeted ultrasound performed in the left breast at 1 o'clock 10 cm from the nipple demonstrating an irregular hypoechoic mass measuring 0.7 x 0.6 x 0.8 cm. This corresponds to the mammographic finding.   Targeted ultrasound the left axilla demonstrates normal lymph nodes.   02/09/2023 Pathology Results   Pathology results showed grade 3 IDC, triple neg, Ki 67 80%   02/16/2023 Initial Diagnosis   Malignant neoplasm of upper-outer quadrant of left breast in female, estrogen receptor negative (HCC)    Genetic Testing   [facility] Custom Panel+RNA was Negative. Report date is 03/02/2023.  The Custom Hereditary Cancers Panel offered by [facility] includes sequencing and/or deletion duplication testing of the following 43 genes: APC, ATM, AXIN2, BAP1, BARD1, BMPR1A, BRCA1, BRCA2, BRIP1, CDH1, CDK4, CDKN2A (p14ARF and p16INK4a only), CHEK2, CTNNA1, EPCAM (Deletion/duplication testing only), FH, GREM1 (promoter region duplication testing only), HOXB13, KIT, MBD4, MEN1, MLH1, MSH2, MSH3, MSH6, MUTYH, NF1, NHTL1, PALB2, PDGFRA, PMS2, POLD1, POLE, PTEN, RAD51C, RAD51D, SMAD4, SMARCA4. STK11, TP53, TSC1, TSC2, and VHL.   02/18/2023 Cancer Staging   Staging form: Breast, AJCC 8th Edition - Clinical stage from 02/18/2023: Stage IB (cT1b, cN0, cM0, G3, ER-, PR-, HER2-) - Signed by Rachel Moulds, MD on 04/09/2023 Stage prefix: Initial diagnosis Histologic grading system: 3 grade system   04/20/2023 - 05/20/2023 Radiation Therapy   Left breast - 40.05 Gy delivered in 15 Fx at 2.67 Gy/Fx Left breast boost - 12 Gy delivered in 6 Fx at 2 Gy/Fx     INTERVAL HISTORY:  Heather Roberson to review her survivorship care plan detailing her treatment  course for breast cancer, as well as monitoring long-term side effects of that treatment, education regarding health maintenance, screening, and overall wellness and health promotion.     Overall, Heather Roberson reports feeling quite well.  She denies any current issues today.  She tells me that she is healing well from her surgery and radiation.  Her daughter is with her today and notes she has lost some weight and has a decreased appetite.  Her weight is down 9 pounds since May of this year.    REVIEW OF SYSTEMS:  Review of Systems  Constitutional:  Negative for appetite change, chills, fatigue, fever and unexpected weight change.  HENT:   Negative for hearing loss, lump/mass and trouble swallowing.   Eyes:  Negative for eye problems and icterus.  Respiratory:  Negative for chest tightness, cough and shortness of breath.   Cardiovascular:  Negative for chest pain, leg swelling and palpitations.  Gastrointestinal:  Negative for abdominal distention, abdominal pain, constipation, diarrhea, nausea and vomiting.  Endocrine: Negative for hot flashes.  Genitourinary:  Negative for difficulty urinating.   Musculoskeletal:  Negative for arthralgias.  Skin:  Negative for itching and rash.  Neurological:  Negative for dizziness, extremity weakness, headaches and numbness.  Hematological:  Negative for adenopathy. Does not bruise/bleed easily.  Psychiatric/Behavioral:  Negative for depression. The patient is not nervous/anxious.    Breast: Denies any new nodularity, masses, tenderness, nipple changes, or nipple discharge.       PAST MEDICAL/SURGICAL HISTORY:  Past Medical History:  Diagnosis Date   Arthritis    History of radiation therapy    Left breast- 04/20/23-05/20/23- Dr. Antony Blackbird  Hypertension    Past Surgical History:  Procedure Laterality Date   ABDOMINAL HYSTERECTOMY     BREAST BIOPSY Left 02/09/2023   [place] LT BREAST BX W LOC DEV 1ST LESION IMG BX SPEC US GUIDE 02/09/2023 [facility]  MAMMOGRAPHY   BREAST BIOPSY Left 03/02/2023   [place] LT RADIOACTIVE SEED LOC 03/02/2023 [facility] MAMMOGRAPHY   BREAST LUMPECTOMY WITH RADIOACTIVE SEED AND SENTINEL LYMPH NODE BIOPSY Left 03/03/2023   Procedure: LEFT BREAST LUMPECTOMY WITH RADIOACTIVE SEED AND AXILLARY SENTINEL LYMPH NODE BIOPSY;  Surgeon: Manus Rudd, MD;  Location: [facility] OR;  Service: General;  Laterality: Left;   CHOLECYSTECTOMY     PORTACATH PLACEMENT Right 03/03/2023   Procedure: INSERTION PORT-A-CATH;  Surgeon: Manus Rudd, MD;  Location: [facility] OR;  Service: General;  Laterality: Right;     ALLERGIES:  Allergies  Allergen Reactions   Other Cough    "SOME FOODS" (cannot name specific foods, however)     CURRENT MEDICATIONS:  Outpatient Encounter Medications as of 07/09/2023  Medication Sig Note   acetaminophen (TYLENOL) 500 MG tablet Take 500 mg by mouth every 6 (six) hours as needed for moderate pain.    amLODipine (NORVASC) 2.5 MG tablet Take 2.5 mg by mouth daily.    ascorbic acid (VITAMIN C) 500 MG tablet Take 500 mg by mouth daily.    Calcium Carb-Cholecalciferol (CALCIUM 600+D) 600-10 MG-MCG TABS Take 1 tablet by mouth daily at 6 (six) AM.    CALCIUM PO Take 1 tablet by mouth daily.    carbamide peroxide (DEBROX) 6.5 % OTIC solution Place 5 drops into both ears 2 (two) times daily.    Carboxymethylcellul-Glycerin (CLEAR EYES FOR DRY EYES OP) Place 1-2 drops into both eyes 2 (two) times daily as needed (for dryness).    cholecalciferol (VITAMIN D) 1000 units tablet Take 1,000 Units by mouth daily.    ferrous sulfate 325 (65 FE) MG tablet Take 325 mg by mouth daily with breakfast.    fluticasone (FLONASE) 50 MCG/ACT nasal spray Place 1-2 sprays into both nostrils daily as needed for allergies or rhinitis.    hydroxychloroquine (PLAQUENIL) 200 MG tablet Take 200 mg by mouth daily.    levocetirizine (XYZAL) 5 MG tablet Take 5 mg by mouth every evening.    mirtazapine (REMERON) 7.5 MG tablet Take 7.5 mg by mouth at  bedtime. 02/24/2023: Patient verified she is taking this medication and dose is correct per patient    omega-3 acid ethyl esters (LOVAZA) 1 g capsule Take 1 g by mouth daily.    predniSONE (DELTASONE) 5 MG tablet Take 5 mg by mouth daily as needed (For pain). 02/24/2023: Patient verified she takes as needed for pain   traMADol (ULTRAM) 50 MG tablet Take 1 tablet (50 mg total) by mouth every 6 (six) hours as needed for moderate pain or severe pain.    No facility-administered encounter medications on file as of 07/09/2023.     ONCOLOGIC FAMILY HISTORY:  Family History  Family history unknown: Yes     SOCIAL HISTORY:  Social History   Socioeconomic History   Marital status: Divorced    Spouse name: Not on file   Number of children: Not on file   Years of education: Not on file   Highest education level: Not on file  Occupational History   Not on file  Tobacco Use   Smoking status: Never   Smokeless tobacco: Never  Vaping Use   Vaping status: Never Used  Substance and Sexual Activity  Alcohol use: No   Drug use: No   Sexual activity: Not on file  Other Topics Concern   Not on file  Social History Narrative   Not on file   Social Determinants of Health   Financial Resource Strain: Medium Risk (02/18/2023)   Overall Financial Resource Strain (CARDIA)    Difficulty of Paying Living Expenses: Somewhat hard  Food Insecurity: Food Insecurity Present (04/18/2023)   Hunger Vital Sign    Worried About Running Out of Food in the Last Year: Sometimes true    Ran Out of Food in the Last Year: Sometimes true  Transportation Needs: No Transportation Needs (04/18/2023)   PRAPARE - Administrator, Civil Service (Medical): No    Lack of Transportation (Non-Medical): No  Physical Activity: Not on file  Stress: Not on file  Social Connections: Not on file  Intimate Partner Violence: Not At Risk (04/18/2023)   Humiliation, Afraid, Rape, and Kick questionnaire    Fear of Current  or Ex-Partner: No    Emotionally Abused: No    Physically Abused: No    Sexually Abused: No     OBSERVATIONS/OBJECTIVE:  BP 123/88 (BP Location: Right Arm, Patient Position: Sitting)   Pulse 100   Temp 98.2 F (36.8 C) (Oral)   Resp 16   Ht 5\' 3"  (1.6 m)   Wt 105 lb 11.2 oz (47.9 kg)   SpO2 100%   BMI 18.72 kg/m  GENERAL: Patient is a well appearing female in no acute distress HEENT:  Sclerae anicteric.  Oropharynx clear and moist. No ulcerations or evidence of oropharyngeal candidiasis. Neck is supple.  NODES:  No cervical, supraclavicular, or axillary lymphadenopathy palpated.  BREAST EXAM:  left breast s/p lumpectomy and radiation, no sign of local recurrence, right breast benign.   LUNGS:  Clear to auscultation bilaterally.  No wheezes or rhonchi. HEART:  Regular rate and rhythm. No murmur appreciated. ABDOMEN:  Soft, nontender.  Positive, normoactive bowel sounds. No organomegaly palpated. MSK:  No focal spinal tenderness to palpation. Full range of motion bilaterally in the upper extremities. EXTREMITIES:  No peripheral edema.   SKIN:  Clear with no obvious rashes or skin changes. No nail dyscrasia. NEURO:  Nonfocal. Well oriented.  Appropriate affect.   LABORATORY DATA:  None for this visit.  DIAGNOSTIC IMAGING:  None for this visit.      ASSESSMENT AND PLAN:  Ms.. Roberson is a pleasant 72 y.o. female with Stage IB left breast invasive ductal carcinoma, ER+/PR+/HER2-, diagnosed in 01/2023, treated with lumpectomy, adjuvant radiation therapy (declined adjuvant chemotherapy per Dr. Marko Stai note).  She presents to the Survivorship [facility] for our initial meeting and routine follow-up post-completion of treatment for breast cancer.    1. Stage IB left breast cancer:  Heather Roberson is continuing to recover from definitive treatment for breast cancer. She will follow-up with her medical oncologist, Dr.  Al Pimple in 6 months with history and physical exam per surveillance  protocol.   Her mammogram is due 01/2024; orders placed today.   Today, a comprehensive survivorship care plan and treatment summary was reviewed with the patient today detailing her breast cancer diagnosis, treatment course, potential late/long-term effects of treatment, appropriate follow-up care with recommendations for the future, and patient education resources.  A copy of this summary, along with a letter will be sent to the patient's primary care provider via mail/fax/In Basket message after today's visit.    2. Weight loss: This is a new issue, will  see her back in 4 weeks for weight recheck.    3. Bone health:  She was given education on specific activities to promote bone health.  4. Cancer screening:  Due to Heather Roberson's history and her age, she should receive screening for skin cancers, colon cancer.  The information and recommendations are listed on the patient's comprehensive care plan/treatment summary and were reviewed in detail with the patient.    5. Health maintenance and wellness promotion: Heather Roberson was encouraged to consume 5-7 servings of fruits and vegetables per day. We reviewed the "Nutrition Rainbow" handout.  She was also encouraged to engage in moderate to vigorous exercise for 30 minutes per day most days of the week.  She was instructed to limit her alcohol consumption and continue to abstain from tobacco use.     6. Support services/counseling: It is not uncommon for this period of the patient's cancer care trajectory to be one of many emotions and stressors.   She was given information regarding our available services and encouraged to contact me with any questions or for help enrolling in any of our support group/programs.    Follow up instructions:    -Return to cancer center in 4 weeks for weight check and in 6 months for Dr. Al Pimple f/u  -Mammogram due in 01/2024 -She is welcome to return back to the Survivorship Clinic at any time; no additional follow-up needed  at this time.  -Consider referral back to survivorship as a long-term survivor for continued surveillance  The patient was provided an opportunity to ask questions and all were answered. The patient agreed with the plan and demonstrated an understanding of the instructions.   Total encounter time:45 minutes*in face-to-face visit time, chart review, lab review, care coordination, order entry, and documentation of the encounter time.    Lillard Anes, NP 07/14/23 10:49 PM Medical Oncology and Hematology [facility] [address] [place], [place] [address] Tel. [phone]    Fax. [phone]  *Total Encounter Time as defined by the Centers for Medicare and Medicaid Services includes, in addition to the face-to-face time of a patient visit (documented in the note above) non-face-to-face time: obtaining and reviewing outside history, ordering and reviewing medications, tests or procedures, care coordination (communications with other health care professionals or caregivers) and documentation in the medical record.

## 2023-07-14 ENCOUNTER — Encounter: Payer: Self-pay | Admitting: Adult Health

## 2023-07-22 ENCOUNTER — Encounter: Payer: Self-pay | Admitting: Hematology and Oncology

## 2023-07-23 ENCOUNTER — Ambulatory Visit: Payer: Self-pay | Admitting: Surgery

## 2023-07-23 ENCOUNTER — Telehealth: Payer: Self-pay

## 2023-07-23 NOTE — H&P (Signed)
Heather Roberson is an 72 y.o. female.   Chief Complaint: left breast cancer HPI: This is a 72 year old female with multiple medical issues who presents with recent discovery of a palpable mass in the left upper outer quadrant of her breast near the axilla. She underwent mammogram and ultrasound that revealed a 0.7 x 0.6 x 0.8 cm mass located at 0100, 10 cmfn. Biopsy revealed IDC-3, triple negative, Ki67 80%. No family history of breast cancer. No previous breast problem   On 03/03/2023, she underwent port placement and a left breast radioactive seed localized lumpectomy with sentinel lymph node biopsy. Pathology revealed a 9 mm grade 3 invasive ductal carcinoma with associated DCIS. Margins were negative. 2 sentinel lymph nodes were negative. Genetic testing was negative.   She completed radiation therapy but declined any adjuvant chemotherapy.  She presents now for port removal.  Past Medical History:  Diagnosis Date   Arthritis    History of radiation therapy    Left breast- 04/20/23-05/20/23- Dr. Antony Blackbird   Hypertension     Past Surgical History:  Procedure Laterality Date   ABDOMINAL HYSTERECTOMY     BREAST BIOPSY Left 02/09/2023   [place] LT BREAST BX W LOC DEV 1ST LESION IMG BX SPEC US GUIDE 02/09/2023 [facility] MAMMOGRAPHY   BREAST BIOPSY Left 03/02/2023   [place] LT RADIOACTIVE SEED LOC 03/02/2023 [facility] MAMMOGRAPHY   BREAST LUMPECTOMY WITH RADIOACTIVE SEED AND SENTINEL LYMPH NODE BIOPSY Left 03/03/2023   Procedure: LEFT BREAST LUMPECTOMY WITH RADIOACTIVE SEED AND AXILLARY SENTINEL LYMPH NODE BIOPSY;  Surgeon: Manus Rudd, MD;  Location: [facility];  Service: General;  Laterality: Left;   CHOLECYSTECTOMY     PORTACATH PLACEMENT Right 03/03/2023   Procedure: INSERTION PORT-A-CATH;  Surgeon: Manus Rudd, MD;  Location: [facility] OR;  Service: General;  Laterality: Right;    Family History  Family history unknown: Yes   Social History:  reports that she has never smoked. She has never used  smokeless tobacco. She reports that she does not drink alcohol and does not use drugs.  Allergies:  Allergies  Allergen Reactions   Other Cough    "SOME FOODS" (cannot name specific foods, however)    Prior to Admission medications   Medication Sig Start Date End Date Taking? Authorizing Provider  acetaminophen (TYLENOL) 500 MG tablet Take 500 mg by mouth every 6 (six) hours as needed for moderate pain.    [provider]  amLODipine (NORVASC) 2.5 MG tablet Take 2.5 mg by mouth daily.    [provider]  ascorbic acid (VITAMIN C) 500 MG tablet Take 500 mg by mouth daily.    [provider]  Calcium Carb-Cholecalciferol (CALCIUM 600+D) 600-10 MG-MCG TABS Take 1 tablet by mouth daily at 6 (six) AM.    [provider]  CALCIUM PO Take 1 tablet by mouth daily.    [provider]  carbamide peroxide (DEBROX) 6.5 % OTIC solution Place 5 drops into both ears 2 (two) times daily. 12/03/19   Bing Neighbors, NP  Carboxymethylcellul-Glycerin (CLEAR EYES FOR DRY EYES OP) Place 1-2 drops into both eyes 2 (two) times daily as needed (for dryness).    [provider]  cholecalciferol (VITAMIN D) 1000 units tablet Take 1,000 Units by mouth daily.    [provider]  ferrous sulfate 325 (65 FE) MG tablet Take 325 mg by mouth daily with breakfast.    [provider]  fluticasone (FLONASE) 50 MCG/ACT nasal spray Place 1-2 sprays into  both nostrils daily as needed for allergies or rhinitis.    [provider]  hydroxychloroquine (PLAQUENIL) 200 MG tablet Take 200 mg by mouth daily. 02/05/16   [provider]  levocetirizine (XYZAL) 5 MG tablet Take 5 mg by mouth every evening. 02/10/22   [provider]  mirtazapine (REMERON) 7.5 MG tablet Take 7.5 mg by mouth at bedtime.    [provider]  omega-3 acid ethyl esters (LOVAZA) 1 g capsule Take 1 g by mouth daily.    [provider]  predniSONE  (DELTASONE) 5 MG tablet Take 5 mg by mouth daily as needed (For pain).    [provider]  traMADol (ULTRAM) 50 MG tablet Take 1 tablet (50 mg total) by mouth every 6 (six) hours as needed for moderate pain or severe pain. 03/03/23   Manus Rudd, MD      Physical Exam  GENERAL: Patient is a well appearing female in no acute distress HEENT:  Sclerae anicteric.  Oropharynx clear and moist. No ulcerations or evidence of oropharyngeal candidiasis. Neck is supple.  NODES:  No cervical, supraclavicular, or axillary lymphadenopathy palpated.  BREAST EXAM:  left breast s/p lumpectomy and radiation, no sign of local recurrence, right breast benign.   LUNGS:  Clear to auscultation bilaterally.  No wheezes or rhonchi. HEART:  Regular rate and rhythm. No murmur appreciated. ABDOMEN:  Soft, nontender.  Positive, normoactive bowel sounds. No organomegaly palpated. MSK:  No focal spinal tenderness to palpation. Full range of motion bilaterally in the upper extremities. EXTREMITIES:  No peripheral edema.   SKIN:  Clear with no obvious rashes or skin changes. No nail dyscrasia. NEURO:  Nonfocal. Well oriented.  Appropriate affect. Assessment/Plan Breast cancer  Port removal.  The surgical procedure has been discussed with the patient.  Potential risks, benefits, alternative treatments, and expected outcomes have been explained.  All of the patient's questions at this time have been answered.  The likelihood of reaching the patient's treatment goal is good.  The patient understand the proposed surgical procedure and wishes to proceed.   Wynona Luna, MD 07/23/2023, 1:56 PM

## 2023-07-23 NOTE — Telephone Encounter (Signed)
Ms. Demeo called to express concerns regarding removal of porta cath.

## 2023-08-24 ENCOUNTER — Other Ambulatory Visit: Payer: Self-pay

## 2023-08-24 ENCOUNTER — Emergency Department (HOSPITAL_BASED_OUTPATIENT_CLINIC_OR_DEPARTMENT_OTHER): Payer: Medicare PPO | Admitting: Radiology

## 2023-08-24 ENCOUNTER — Emergency Department (HOSPITAL_BASED_OUTPATIENT_CLINIC_OR_DEPARTMENT_OTHER)
Admission: EM | Admit: 2023-08-24 | Discharge: 2023-08-25 | Disposition: A | Discharge status: Home / Self Care | Payer: Medicare PPO | Attending: Emergency Medicine | Admitting: Emergency Medicine

## 2023-08-24 ENCOUNTER — Emergency Department (HOSPITAL_COMMUNITY): Payer: Medicare PPO

## 2023-08-24 DIAGNOSIS — R791 Abnormal coagulation profile: Secondary | ICD-10-CM | POA: Diagnosis not present

## 2023-08-24 DIAGNOSIS — Z79899 Other long term (current) drug therapy: Secondary | ICD-10-CM | POA: Insufficient documentation

## 2023-08-24 DIAGNOSIS — I10 Essential (primary) hypertension: Secondary | ICD-10-CM | POA: Diagnosis not present

## 2023-08-24 DIAGNOSIS — Z20822 Contact with and (suspected) exposure to covid-19: Secondary | ICD-10-CM | POA: Diagnosis not present

## 2023-08-24 DIAGNOSIS — Z853 Personal history of malignant neoplasm of breast: Secondary | ICD-10-CM | POA: Insufficient documentation

## 2023-08-24 DIAGNOSIS — R531 Weakness: Secondary | ICD-10-CM | POA: Insufficient documentation

## 2023-08-24 DIAGNOSIS — R0602 Shortness of breath: Secondary | ICD-10-CM | POA: Insufficient documentation

## 2023-08-24 DIAGNOSIS — R7989 Other specified abnormal findings of blood chemistry: Secondary | ICD-10-CM

## 2023-08-24 LAB — D-DIMER, QUANTITATIVE: D-Dimer, Quant: 1.13 ug{FEU}/mL — ABNORMAL HIGH (ref 0.00–0.50)

## 2023-08-24 LAB — BASIC METABOLIC PANEL
Anion gap: 7 (ref 5–15)
BUN: 13 mg/dL (ref 8–23)
CO2: 26 mmol/L (ref 22–32)
Calcium: 9.7 mg/dL (ref 8.9–10.3)
Chloride: 101 mmol/L (ref 98–111)
Creatinine, Ser: 0.79 mg/dL (ref 0.44–1.00)
GFR, Estimated: >60 mL/min (ref >60)
Glucose, Bld: 91 mg/dL (ref 70–99)
Potassium: 3.8 mmol/L (ref 3.5–5.1)
Sodium: 134 mmol/L — ABNORMAL LOW (ref 135–145)

## 2023-08-24 LAB — CBC
HCT: 32.5 % — ABNORMAL LOW (ref 36.0–46.0)
Hemoglobin: 10.6 g/dL — ABNORMAL LOW (ref 12.0–15.0)
MCH: 31 pg (ref 26.0–34.0)
MCHC: 32.6 g/dL (ref 30.0–36.0)
MCV: 95 fL (ref 80.0–100.0)
Platelets: 214 10*3/uL (ref 150–400)
RBC: 3.42 MIL/uL — ABNORMAL LOW (ref 3.87–5.11)
RDW: 11.9 % (ref 11.5–15.5)
WBC: 4.4 10*3/uL (ref 4.0–10.5)
nRBC: 0 % (ref 0.0–0.2)

## 2023-08-24 LAB — URINALYSIS, ROUTINE W REFLEX MICROSCOPIC
Bilirubin Urine: NEGATIVE
Glucose, UA: NEGATIVE mg/dL
Hgb urine dipstick: NEGATIVE
Ketones, ur: NEGATIVE mg/dL
Leukocytes,Ua: NEGATIVE
Nitrite: NEGATIVE
Protein, ur: NEGATIVE mg/dL
Specific Gravity, Urine: <1.005 — ABNORMAL LOW (ref 1.005–1.030)
pH: 6.5 (ref 5.0–8.0)

## 2023-08-24 LAB — RESP PANEL BY RT-PCR (RSV, FLU A&B, COVID)  RVPGX2
Influenza A by PCR: NEGATIVE
Influenza B by PCR: NEGATIVE
Resp Syncytial Virus by PCR: NEGATIVE
SARS Coronavirus 2 by RT PCR: NEGATIVE

## 2023-08-24 MED ORDER — IOHEXOL 350 MG/ML SOLN
75.0000 mL | Freq: Once | INTRAVENOUS | Status: AC | PRN
  Administered 2023-08-24: 75 mL via INTRAVENOUS

## 2023-08-24 NOTE — ED Triage Notes (Signed)
Finished chemo breast CA in July. Port removed this month. SOB and fatigue x2 months. Decreased appetite. Multiple complaints. Brought here by concerned family.

## 2023-08-24 NOTE — ED Provider Notes (Signed)
[place] EMERGENCY DEPARTMENT AT [facility] Provider Note   CSN: [id number] Arrival date & time: 08/24/23  1452     History  Chief Complaint  Patient presents with   Shortness of Breath    Heather Roberson is a 72 y.o. female.  With a past medical history of breast cancer status post radiation and hypertension who presents to the ED for fatigue.  Last radiation was in July.  Since that time has experienced progressive fatigue, generalized weakness and shortness of breath with exertion.  Her son who lives in [place] came to visit her today and noted her to be short of breath with labored breathing.  No chest pain fevers chills nausea vomiting or recent illness.  Son also mentions increased urinary frequency over the last couple days.   Shortness of Breath      Home Medications Prior to Admission medications   Medication Sig Start Date End Date Taking? Authorizing Provider  acetaminophen (TYLENOL) 500 MG tablet Take 500 mg by mouth every 6 (six) hours as needed for moderate pain.    [provider]  amLODipine (NORVASC) 2.5 MG tablet Take 2.5 mg by mouth daily.    [provider]  ascorbic acid (VITAMIN C) 500 MG tablet Take 500 mg by mouth daily.    [provider]  Calcium Carb-Cholecalciferol (CALCIUM 600+D) 600-10 MG-MCG TABS Take 1 tablet by mouth daily at 6 (six) AM.    [provider]  CALCIUM PO Take 1 tablet by mouth daily.    [provider]  carbamide peroxide (DEBROX) 6.5 % OTIC solution Place 5 drops into both ears 2 (two) times daily. 12/03/19   Bing Neighbors, NP  Carboxymethylcellul-Glycerin (CLEAR EYES FOR DRY EYES OP) Place 1-2 drops into both eyes 2 (two) times daily as needed (for dryness).    [provider]  cholecalciferol (VITAMIN D) 1000 units tablet Take 1,000 Units by mouth daily.    [provider]  ferrous sulfate 325 (65 FE) MG tablet Take 325 mg by mouth daily with  breakfast.    [provider]  fluticasone (FLONASE) 50 MCG/ACT nasal spray Place 1-2 sprays into both nostrils daily as needed for allergies or rhinitis.    [provider]  hydroxychloroquine (PLAQUENIL) 200 MG tablet Take 200 mg by mouth daily. 02/05/16   [provider]  levocetirizine (XYZAL) 5 MG tablet Take 5 mg by mouth every evening. 02/10/22   [provider]  mirtazapine (REMERON) 7.5 MG tablet Take 7.5 mg by mouth at bedtime.    [provider]  omega-3 acid ethyl esters (LOVAZA) 1 g capsule Take 1 g by mouth daily.    [provider]  predniSONE (DELTASONE) 5 MG tablet Take 5 mg by mouth daily as needed (For pain).    [provider]  traMADol (ULTRAM) 50 MG tablet Take 1 tablet (50 mg total) by mouth every 6 (six) hours as needed for moderate pain or severe pain. 03/03/23   Manus Rudd, MD      Allergies    Other    Review of Systems   Review of Systems  Respiratory:  Positive for shortness of breath.     Physical Exam Updated Vital Signs BP (!) 140/82   Pulse 97   Temp (!) 97 F (36.1 C)   Resp (!) 21   SpO2 98%  Physical Exam Vitals and nursing note reviewed.  HENT:     Head: Normocephalic and atraumatic.  Eyes:     Pupils: Pupils are equal, round, and reactive to light.  Cardiovascular:     Rate and Rhythm: Normal rate and regular rhythm.  Pulmonary:     Effort: Pulmonary effort is normal.     Breath sounds: Normal breath sounds.  Abdominal:     Palpations: Abdomen is soft.     Tenderness: There is no abdominal tenderness.  Skin:    General: Skin is warm and dry.     Comments: Well-healed scar over the right anterior chest from previous port site  Neurological:     Mental Status: She is alert.  Psychiatric:        Mood and Affect: Mood normal.     ED Results / Procedures / Treatments   Labs (all labs ordered are listed, but only abnormal results are displayed) Labs Reviewed  CBC -  Abnormal; Notable for the following components:      Result Value   RBC 3.42 (*)    Hemoglobin 10.6 (*)    HCT 32.5 (*)    All other components within normal limits  BASIC METABOLIC PANEL - Abnormal; Notable for the following components:   Sodium 134 (*)    All other components within normal limits  D-DIMER, QUANTITATIVE - Abnormal; Notable for the following components:   D-Dimer, Quant 1.13 (*)    All other components within normal limits  URINALYSIS, ROUTINE W REFLEX MICROSCOPIC - Abnormal; Notable for the following components:   Color, Urine COLORLESS (*)    Specific Gravity, Urine <1.005 (*)    All other components within normal limits  RESP PANEL BY RT-PCR (RSV, FLU A&B, COVID)  RVPGX2    EKG None  Radiology DG Chest 2 View  Result Date: 08/24/2023 CLINICAL DATA:  Dyspnea, breast cancer EXAM: CHEST - 2 VIEW COMPARISON:  03/03/2023 chest radiograph. FINDINGS: Interval removal of right internal jugular Port-A-Cath. Surgical clips overlie the left breast. Stable cardiomediastinal silhouette with normal heart size. No pneumothorax. No pleural effusion. Lungs appear clear, with no acute consolidative airspace disease and no pulmonary edema. Surgical clips are seen in the right upper quadrant of the abdomen. IMPRESSION: No active cardiopulmonary disease. Electronically Signed   By: Delbert Phenix M.D.   On: 08/24/2023 18:04    Procedures Procedures    Medications Ordered in ED Medications - No data to display  ED Course/ Medical Decision Making/ A&P Clinical Course as of 08/24/23 1950  Mon Aug 24, 2023  1947 D-dimer significantly elevated at 1.13.  Patient will require CTA of the chest to evaluate for pulmonary embolism.  Unfortunately her CT scan is down tonight.  She will travel to the [facility] ED via private transportation with her son to obtain the CTA of the chest there.  We will make the charge nurse aware of this plan at [facility] [MP]    Clinical Course User Index [MP] Royanne Foots, DO                                 Medical Decision Making 72 year old female with history as above presenting for increasing fatigue, generalized weakness and shortness of breath.  No recent falls.  No focal neurologic deficits.  Afebrile and normotensive on exam.  No tachycardia or significant hypoxia however she does have a history of breast cancer with recent radiation.  She would be at risk for pulmonary embolism.  Will obtain D-dimer and CT of  the chest if D-dimer is elevated to evaluate for PE.  Other causes of generalized fatigue and weakness include anemia or electrolyte imbalance.  Will obtain laboratory workup.  Will also obtain urinalysis to evaluate for UTI given history of increased urinary frequency  Amount and/or Complexity of Data Reviewed Labs: ordered. Radiology: ordered.           Final Clinical Impression(s) / ED Diagnoses Final diagnoses:  Elevated d-dimer  Shortness of breath  Weakness    Rx / DC Orders ED Discharge Orders     None         Royanne Foots, DO 08/24/23 1950

## 2023-08-24 NOTE — ED Provider Notes (Signed)
[place] EMERGENCY DEPARTMENT AT [facility] Provider Note   CSN: [id number] Arrival date & time: 08/24/23  1452     History  Chief Complaint  Patient presents with   Shortness of Breath    SELENIA NOONE is a 72 y.o. female past medical history of breast cancer status post radiation , hypertension, and arthritis presents with complaints of feeling fatigue and short of breath.  States his symptoms have persisted since completing radiation in July.  Her son from [place] is visiting and noticed today patient had increased work of breathing and decided bring her into the emergency department.  She was seen and evaluated earlier this evening at [facility] by Dr. Estelle June. workup was notable for elevated D-dimer 1.13.  CT scan was down at the facility and was transferred here for CTA to evaluate for pulmonary embolism.  Past Medical History:  Diagnosis Date   Arthritis    History of radiation therapy    Left breast- 04/20/23-05/20/23- Dr. Antony Blackbird   Hypertension       Shortness of Breath Associated symptoms: no abdominal pain and no chest pain        Home Medications Prior to Admission medications   Medication Sig Start Date End Date Taking? Authorizing Provider  acetaminophen (TYLENOL) 500 MG tablet Take 500 mg by mouth every 6 (six) hours as needed for moderate pain.    [provider]  amLODipine (NORVASC) 2.5 MG tablet Take 2.5 mg by mouth daily.    [provider]  ascorbic acid (VITAMIN C) 500 MG tablet Take 500 mg by mouth daily.    [provider]  Calcium Carb-Cholecalciferol (CALCIUM 600+D) 600-10 MG-MCG TABS Take 1 tablet by mouth daily at 6 (six) AM.    [provider]  CALCIUM PO Take 1 tablet by mouth daily.    [provider]  carbamide peroxide (DEBROX) 6.5 % OTIC solution Place 5 drops into both ears 2 (two) times daily. 12/03/19   Bing Neighbors, NP  Carboxymethylcellul-Glycerin (CLEAR  EYES FOR DRY EYES OP) Place 1-2 drops into both eyes 2 (two) times daily as needed (for dryness).    [provider]  cholecalciferol (VITAMIN D) 1000 units tablet Take 1,000 Units by mouth daily.    [provider]  ferrous sulfate 325 (65 FE) MG tablet Take 325 mg by mouth daily with breakfast.    [provider]  fluticasone (FLONASE) 50 MCG/ACT nasal spray Place 1-2 sprays into both nostrils daily as needed for allergies or rhinitis.    [provider]  hydroxychloroquine (PLAQUENIL) 200 MG tablet Take 200 mg by mouth daily. 02/05/16   [provider]  levocetirizine (XYZAL) 5 MG tablet Take 5 mg by mouth every evening. 02/10/22   [provider]  mirtazapine (REMERON) 7.5 MG tablet Take 7.5 mg by mouth at bedtime.    [provider]  omega-3 acid ethyl esters (LOVAZA) 1 g capsule Take 1 g by mouth daily.    [provider]  predniSONE (DELTASONE) 5 MG tablet Take 5 mg by mouth daily as needed (For pain).    [provider]  traMADol (ULTRAM) 50 MG tablet Take 1 tablet (50 mg total) by mouth every 6 (six) hours as needed for moderate pain or severe pain. 03/03/23   Manus Rudd, MD      Allergies    Other    Review of Systems   Review of Systems  Constitutional:  Positive for  fatigue.  Respiratory:  Positive for shortness of breath. Negative for chest tightness.   Cardiovascular:  Negative for chest pain.  Gastrointestinal:  Negative for abdominal pain and blood in stool.  Genitourinary:  Negative for dysuria.    Physical Exam Updated Vital Signs BP (!) 146/83   Pulse 87   Temp 97.9 F (36.6 C) (Oral)   Resp (!) 22   Ht 5\' 3"  (1.6 m)   Wt 47.6 kg   SpO2 100%   BMI 18.60 kg/m  Physical Exam Vitals and nursing note reviewed.  Constitutional:      Appearance: She is well-developed.     Comments: Chronically ill-appearing in no acute distress  HENT:     Head: Normocephalic and atraumatic.  Eyes:      Conjunctiva/sclera: Conjunctivae normal.  Cardiovascular:     Rate and Rhythm: Normal rate and regular rhythm.     Heart sounds: No murmur heard. Pulmonary:     Effort: Pulmonary effort is normal. No respiratory distress.     Breath sounds: Normal breath sounds.  Abdominal:     Palpations: Abdomen is soft.     Tenderness: There is no abdominal tenderness.  Musculoskeletal:        General: No swelling.     Cervical back: Neck supple.     Right lower leg: No edema.     Left lower leg: No edema.  Skin:    General: Skin is warm and dry.     Capillary Refill: Capillary refill takes less than 2 seconds.  Neurological:     General: No focal deficit present.     Mental Status: She is alert.  Psychiatric:        Mood and Affect: Mood normal.     ED Results / Procedures / Treatments   Labs (all labs ordered are listed, but only abnormal results are displayed) Labs Reviewed  CBC - Abnormal; Notable for the following components:      Result Value   RBC 3.42 (*)    Hemoglobin 10.6 (*)    HCT 32.5 (*)    All other components within normal limits  BASIC METABOLIC PANEL - Abnormal; Notable for the following components:   Sodium 134 (*)    All other components within normal limits  D-DIMER, QUANTITATIVE - Abnormal; Notable for the following components:   D-Dimer, Quant 1.13 (*)    All other components within normal limits  URINALYSIS, ROUTINE W REFLEX MICROSCOPIC - Abnormal; Notable for the following components:   Color, Urine COLORLESS (*)    Specific Gravity, Urine <1.005 (*)    All other components within normal limits  RESP PANEL BY RT-PCR (RSV, FLU A&B, COVID)  RVPGX2    EKG EKG Interpretation Date/Time:  Monday August 24 2023 15:36:00 EST Ventricular Rate:  93 PR Interval:  156 QRS Duration:  82 QT Interval:  356 QTC Calculation: 442 R Axis:   63  Text Interpretation: Normal sinus rhythm Cannot rule out Anterior infarct (cited on or before 03-Dec-2019) Abnormal  ECG When compared with ECG of 24-Feb-2023 15:16, No significant change was found Confirmed by Estelle June [phone]) on 08/24/2023 7:52:00 PM  Radiology CT Angio Chest PE W and/or Wo Contrast  Result Date: 08/24/2023 CLINICAL DATA:  Breast cancer, status post chemotherapy, shortness of breath and fatigue EXAM: CT ANGIOGRAPHY CHEST WITH CONTRAST TECHNIQUE: Multidetector CT imaging of the chest was performed using the standard protocol during bolus administration of intravenous contrast. Multiplanar CT image reconstructions and MIPs were  obtained to evaluate the vascular anatomy. RADIATION DOSE REDUCTION: This exam was performed according to the departmental dose-optimization program which includes automated exposure control, adjustment of the mA and/or kV according to patient size and/or use of iterative reconstruction technique. CONTRAST:  75mL OMNIPAQUE IOHEXOL 350 MG/ML SOLN COMPARISON:  Chest radiograph dated 08/24/2023 FINDINGS: Cardiovascular: Satisfactory opacification of the bilateral pulmonary arteries to the segmental level. No evidence of pulmonary embolism. Although not tailored for evaluation of the thoracic aorta, there is no evidence of thoracic aortic aneurysm or dissection. Atherosclerotic calcifications of the aortic arch. The heart is normal in size.  No pericardial effusion. Moderate three-vessel coronary atherosclerosis. Mediastinum/Nodes: No suspicious mediastinal lymphadenopathy. Status post left axillary lymph node dissection. Visualized thyroid is unremarkable. Lungs/Pleura: Mild subpleural patchy opacity in the left upper lobe (series 6/image 70), favoring radiation changes given the patient's clinical history. Mild bibasilar atelectasis. No suspicious pulmonary nodules. No pleural effusion or pneumothorax. Upper Abdomen: Visualized upper abdomen is grossly unremarkable. Musculoskeletal: Visualized osseous structures are within normal limits. Review of the MIP images confirms the above  findings. IMPRESSION: No evidence of pulmonary embolism. Suspected radiation changes in the left upper lobe. Status post left axillary lymph node dissection. No evidence of metastatic disease. Aortic Atherosclerosis (ICD10-I70.0). Electronically Signed   By: Charline Bills M.D.   On: 08/24/2023 23:37   DG Chest 2 View  Result Date: 08/24/2023 CLINICAL DATA:  Dyspnea, breast cancer EXAM: CHEST - 2 VIEW COMPARISON:  03/03/2023 chest radiograph. FINDINGS: Interval removal of right internal jugular Port-A-Cath. Surgical clips overlie the left breast. Stable cardiomediastinal silhouette with normal heart size. No pneumothorax. No pleural effusion. Lungs appear clear, with no acute consolidative airspace disease and no pulmonary edema. Surgical clips are seen in the right upper quadrant of the abdomen. IMPRESSION: No active cardiopulmonary disease. Electronically Signed   By: Delbert Phenix M.D.   On: 08/24/2023 18:04    Procedures Procedures    Medications Ordered in ED Medications  iohexol (OMNIPAQUE) 350 MG/ML injection 75 mL (75 mLs Intravenous Contrast Given 08/24/23 2329)    ED Course/ Medical Decision Making/ A&P Clinical Course as of 08/25/23 0221  Mon Aug 24, 2023  1947 D-dimer significantly elevated at 1.13.  Patient will require CTA of the chest to evaluate for pulmonary embolism.  Unfortunately her CT scan is down tonight.  She will travel to the [facility] ED via private transportation with her son to obtain the CTA of the chest there.  We will make the charge nurse aware of this plan at [facility] [MP]    Clinical Course User Index [MP] Royanne Foots, DO                                 Medical Decision Making CHARMAIN VALIS is a 72 y.o. female past medical history of breast cancer status post radiation , hypertension, and arthritis presents with complaints of feeling fatigue and short of breath.  Differential includes ACS, PE, AAA, CHF, pneumonia.  Additional historians include son at  bedside.  External records include ED note from [facility], by Dr. Elayne Snare.  Patient's workup was notable for elevated dimer, however fortunately CT scan was found at that facility and patient was transferred here for CTA for further evaluation of PE.  This test proved negative.  Remainder of workup [facility] presents overall unremarkable.  Patient was able to ambulate here without difficulty, chest pain, increased shortness of breath or  reduction in the O2 sat.  She has remained hemodynamically stable throughout her visit.  Clinical exam is overall benign.  Using shared decision making with patient and her son we agree that patient was safe to return home with plan of close follow-up with PCP for further evaluation.  No critical interventions, consultations, or cardiac monitoring indicated.  No social determinants of health.  Patient safe for discharge home.  Amount and/or Complexity of Data Reviewed Labs: ordered. Radiology: ordered.  Risk Prescription drug management.           Final Clinical Impression(s) / ED Diagnoses Final diagnoses:  Elevated d-dimer  Shortness of breath  Weakness    Rx / DC Orders ED Discharge Orders     None         Fabienne Bruns 08/25/23 0221    Blane Ohara, MD 08/31/23 1610

## 2023-08-24 NOTE — Discharge Instructions (Addendum)
You were seen in the emergency department for shortness of breath and weakness As discussed, your blood test called a D-dimer was elevated here Unfortunately our CAT scan is down here and we cannot offer this test tonight It is important that you go directly from here to [facility] emergency department to have a CAT scan of your chest taken to look for a blood clot in your lungs We have made the [facility] ED aware of this plan

## 2023-08-25 NOTE — ED Notes (Signed)
Pt ambulated to restroom with this RN with pulse ox monitoring. Pt denied complaints while ambulating. SpO2 remained 92-100 while ambulating.

## 2024-01-29 ENCOUNTER — Ambulatory Visit
Admission: RE | Admit: 2024-01-29 | Discharge: 2024-01-29 | Disposition: A | Discharge status: Home / Self Care | Payer: Medicare PPO | Source: Ambulatory Visit | Attending: Adult Health | Admitting: Adult Health

## 2024-01-29 DIAGNOSIS — Z171 Estrogen receptor negative status [ER-]: Secondary | ICD-10-CM

## 2024-04-01 ENCOUNTER — Ambulatory Visit
Admission: RE | Admit: 2024-04-01 | Discharge: 2024-04-01 | Disposition: A | Discharge status: Home / Self Care | Source: Ambulatory Visit | Attending: Registered Nurse | Admitting: Registered Nurse

## 2024-04-01 ENCOUNTER — Other Ambulatory Visit: Payer: Self-pay | Admitting: Registered Nurse

## 2024-04-01 DIAGNOSIS — M545 Low back pain, unspecified: Secondary | ICD-10-CM

## 2024-05-14 ENCOUNTER — Emergency Department (HOSPITAL_BASED_OUTPATIENT_CLINIC_OR_DEPARTMENT_OTHER)

## 2024-05-14 ENCOUNTER — Encounter (HOSPITAL_BASED_OUTPATIENT_CLINIC_OR_DEPARTMENT_OTHER): Payer: Self-pay | Admitting: Emergency Medicine

## 2024-05-14 ENCOUNTER — Emergency Department (HOSPITAL_BASED_OUTPATIENT_CLINIC_OR_DEPARTMENT_OTHER): Admission: EM | Admit: 2024-05-14 | Discharge: 2024-05-14 | Disposition: A | Discharge status: Home / Self Care

## 2024-05-14 ENCOUNTER — Other Ambulatory Visit: Payer: Self-pay

## 2024-05-14 DIAGNOSIS — M7989 Other specified soft tissue disorders: Secondary | ICD-10-CM | POA: Insufficient documentation

## 2024-05-14 DIAGNOSIS — Z79899 Other long term (current) drug therapy: Secondary | ICD-10-CM | POA: Diagnosis not present

## 2024-05-14 DIAGNOSIS — Z853 Personal history of malignant neoplasm of breast: Secondary | ICD-10-CM | POA: Insufficient documentation

## 2024-05-14 DIAGNOSIS — I10 Essential (primary) hypertension: Secondary | ICD-10-CM | POA: Insufficient documentation

## 2024-05-14 DIAGNOSIS — M79662 Pain in left lower leg: Secondary | ICD-10-CM | POA: Insufficient documentation

## 2024-05-14 DIAGNOSIS — R600 Localized edema: Secondary | ICD-10-CM | POA: Insufficient documentation

## 2024-05-14 LAB — BASIC METABOLIC PANEL WITH GFR
Anion gap: 13 (ref 5–15)
BUN: 17 mg/dL (ref 8–23)
CO2: 22 mmol/L (ref 22–32)
Calcium: 9.7 mg/dL (ref 8.9–10.3)
Chloride: 104 mmol/L (ref 98–111)
Creatinine, Ser: 0.94 mg/dL (ref 0.44–1.00)
GFR, Estimated: >60 mL/min (ref >60)
Glucose, Bld: 71 mg/dL (ref 70–99)
Potassium: 3.4 mmol/L — ABNORMAL LOW (ref 3.5–5.1)
Sodium: 138 mmol/L (ref 135–145)

## 2024-05-14 LAB — PRO BRAIN NATRIURETIC PEPTIDE: Pro Brain Natriuretic Peptide: 39.3 pg/mL (ref <300.0)

## 2024-05-14 LAB — TROPONIN T, HIGH SENSITIVITY
Troponin T High Sensitivity: 22 ng/L — ABNORMAL HIGH (ref <19)
Troponin T High Sensitivity: 16 ng/L (ref <19)

## 2024-05-14 LAB — CBC
HCT: 35.8 % — ABNORMAL LOW (ref 36.0–46.0)
Hemoglobin: 11.9 g/dL — ABNORMAL LOW (ref 12.0–15.0)
MCH: 32.1 pg (ref 26.0–34.0)
MCHC: 33.2 g/dL (ref 30.0–36.0)
MCV: 96.5 fL (ref 80.0–100.0)
Platelets: 180 K/uL (ref 150–400)
RBC: 3.71 MIL/uL — ABNORMAL LOW (ref 3.87–5.11)
RDW: 13.1 % (ref 11.5–15.5)
WBC: 4.8 K/uL (ref 4.0–10.5)
nRBC: 0 % (ref 0.0–0.2)

## 2024-05-14 NOTE — ED Notes (Signed)
 Reviewed discharge instructions and follow up care with pt. Pt verbalized understanding and had no further questions. Pt exited ED without complications.

## 2024-05-14 NOTE — ED Triage Notes (Signed)
 Pt c/o L ankle swelling and soreness since yesterday. States it could be from the socks she had on Recent fall in early June-pelvic fx and feeling off balance.

## 2024-05-14 NOTE — ED Provider Notes (Signed)
 [place] EMERGENCY DEPARTMENT AT [facility] Provider Note   CSN: [id number] Arrival date & time: 05/14/24  1105     Patient presents with: Leg Swelling   Heather Roberson is a 73 y.o. female.   73 year old female with past medical history of breast cancer and hypertension presenting to the emergency department today with left lower extremity swelling and mild pain.  The patient states that this has been going on now for the past 2 days or so.  She denies any fevers.  Denies any chest pain.  She reports some occasional lightheadedness but this is a chronic issue for the patient.  She denies any significant swelling to the right leg.  She denies any hemoptysis.        Prior to Admission medications   Medication Sig Start Date End Date Taking? Authorizing Provider  acetaminophen  (TYLENOL ) 500 MG tablet Take 500 mg by mouth every 6 (six) hours as needed for moderate pain.    [provider]  amLODipine (NORVASC) 2.5 MG tablet Take 2.5 mg by mouth daily.    [provider]  ascorbic acid (VITAMIN C) 500 MG tablet Take 500 mg by mouth daily.    [provider]  Calcium Carb-Cholecalciferol (CALCIUM 600+D) 600-10 MG-MCG TABS Take 1 tablet by mouth daily at 6 (six) AM.    [provider]  CALCIUM PO Take 1 tablet by mouth daily.    [provider]  carbamide peroxide (DEBROX) 6.5 % OTIC solution Place 5 drops into both ears 2 (two) times daily. 12/03/19   Arloa Suzen RAMAN, NP  Carboxymethylcellul-Glycerin (CLEAR EYES FOR DRY EYES OP) Place 1-2 drops into both eyes 2 (two) times daily as needed (for dryness).    [provider]  cholecalciferol (VITAMIN D) 1000 units tablet Take 1,000 Units by mouth daily.    [provider]  ferrous sulfate 325 (65 FE) MG tablet Take 325 mg by mouth daily with breakfast.    [provider]  fluticasone (FLONASE) 50 MCG/ACT nasal spray Place 1-2 sprays into both nostrils  daily as needed for allergies or rhinitis.    [provider]  hydroxychloroquine (PLAQUENIL) 200 MG tablet Take 200 mg by mouth daily. 02/05/16   [provider]  levocetirizine (XYZAL) 5 MG tablet Take 5 mg by mouth every evening. 02/10/22   [provider]  mirtazapine (REMERON) 7.5 MG tablet Take 7.5 mg by mouth at bedtime.    [provider]  omega-3 acid ethyl esters (LOVAZA) 1 g capsule Take 1 g by mouth daily.    [provider]  predniSONE (DELTASONE) 5 MG tablet Take 5 mg by mouth daily as needed (For pain).    [provider]  traMADol  (ULTRAM ) 50 MG tablet Take 1 tablet (50 mg total) by mouth every 6 (six) hours as needed for moderate pain or severe pain. 03/03/23   Belinda Cough, MD    Allergies: Other    Review of Systems  Cardiovascular:  Positive for leg swelling.  All other systems reviewed and are negative.   Updated Vital Signs BP (!) 153/68   Pulse 82   Temp 97.6 F (36.4 C)   Resp (!) 29   Ht 5' 2 (1.575 m)   Wt 47.6 kg   SpO2 100%   BMI 19.20 kg/m   Physical Exam Vitals and nursing note reviewed.   Gen: NAD Eyes: PERRL, EOMI HEENT: no oropharyngeal swelling Neck: trachea midline Resp: clear to auscultation bilaterally  Card: RRR, no murmurs, rubs, or gallops Abd: nontender, nondistended Extremities: no calf tenderness, trace pitting edema to the midshin over the left lower extremity Vascular: 2+ radial pulses bilaterally, 2+ DP pulses bilaterally Skin: no rashes Psyc: acting appropriately   (all labs ordered are listed, but only abnormal results are displayed) Labs Reviewed  BASIC METABOLIC PANEL WITH GFR - Abnormal; Notable for the following components:      Result Value   Potassium 3.4 (*)    All other components within normal limits  CBC - Abnormal; Notable for the following components:   RBC 3.71 (*)    Hemoglobin 11.9 (*)    HCT 35.8 (*)    All other components within normal limits   TROPONIN T, HIGH SENSITIVITY - Abnormal; Notable for the following components:   Troponin T High Sensitivity 22 (*)    All other components within normal limits  PRO BRAIN NATRIURETIC PEPTIDE  TROPONIN T, HIGH SENSITIVITY    EKG: None  Radiology: US  Venous Img Lower Unilateral Left Result Date: 05/14/2024 CLINICAL DATA:  Left ankle swelling EXAM: LEFT LOWER EXTREMITY VENOUS DOPPLER ULTRASOUND TECHNIQUE: Gray-scale sonography with compression, as well as color and duplex ultrasound, were performed to evaluate the deep venous system(s) from the level of the common femoral vein through the popliteal and proximal calf veins. COMPARISON:  None Available. FINDINGS: VENOUS Normal compressibility of the common femoral, superficial femoral, and popliteal veins, as well as the visualized calf veins. Visualized portions of profunda femoral vein and great saphenous vein unremarkable. No filling defects to suggest DVT on grayscale or color Doppler imaging. Doppler waveforms show normal direction of venous flow, normal respiratory plasticity and response to augmentation. Limited views of the contralateral common femoral vein are unremarkable. OTHER None. Limitations: none IMPRESSION: Negative. Electronically Signed   By: Wilkie Lent M.D.   On: 05/14/2024 13:16     Procedures   Medications Ordered in the ED - No data to display                                  Medical Decision Making 73 year old female with past medical history of breast cancer and hypertension presenting to the emergency department today with left lower extremity swelling.  This is very mild here.  She does not have any findings consistent with acute arterial occlusion at this time.  Will further evaluate her here with an ultrasound to evaluate for DVT.  She denies any real symptoms consistent with pulmonary embolism at this time.  Will obtain an EKG given her tachycardia on arrival as well as a troponin and BNP.  If these are  unremarkable I think that she requires further workup.  I will reevaluate for ultimate disposition.  The patient's work appears reassuring.  EKG is nonischemic and she had downtrending troponins.  First was mildly elevated but second 1 is within normal limits.  Ultrasound does not show any DVT.  Think she is stable for discharge.  Amount and/or Complexity of Data Reviewed Labs: ordered.        Final diagnoses:  Left leg swelling    ED Discharge Orders     None          Ula Prentice SAUNDERS, MD 05/14/24 1454

## 2024-05-14 NOTE — Discharge Instructions (Signed)
 Your workup today was reassuring.  Your ultrasound did not show any blood clots.  Please keep your leg elevated and follow-up with your doctor.  Return to the ER for worsening symptoms.

## 2024-07-01 ENCOUNTER — Other Ambulatory Visit: Payer: Self-pay

## 2024-07-01 ENCOUNTER — Emergency Department (HOSPITAL_COMMUNITY)
Admission: EM | Admit: 2024-07-01 | Discharge: 2024-07-02 | Disposition: A | Discharge status: Home / Self Care | Attending: Emergency Medicine | Admitting: Emergency Medicine

## 2024-07-01 DIAGNOSIS — E860 Dehydration: Secondary | ICD-10-CM | POA: Diagnosis not present

## 2024-07-01 DIAGNOSIS — R0600 Dyspnea, unspecified: Secondary | ICD-10-CM | POA: Diagnosis not present

## 2024-07-01 DIAGNOSIS — R531 Weakness: Secondary | ICD-10-CM

## 2024-07-01 DIAGNOSIS — Z79899 Other long term (current) drug therapy: Secondary | ICD-10-CM | POA: Diagnosis not present

## 2024-07-01 DIAGNOSIS — F419 Anxiety disorder, unspecified: Secondary | ICD-10-CM | POA: Insufficient documentation

## 2024-07-01 DIAGNOSIS — Z853 Personal history of malignant neoplasm of breast: Secondary | ICD-10-CM | POA: Diagnosis not present

## 2024-07-01 DIAGNOSIS — I10 Essential (primary) hypertension: Secondary | ICD-10-CM | POA: Diagnosis not present

## 2024-07-01 DIAGNOSIS — R42 Dizziness and giddiness: Secondary | ICD-10-CM | POA: Insufficient documentation

## 2024-07-01 LAB — CBC WITH DIFFERENTIAL/PLATELET
Abs Immature Granulocytes: 0.06 K/uL (ref 0.00–0.07)
Basophils Absolute: 0 K/uL (ref 0.0–0.1)
Basophils Relative: 0 %
Eosinophils Absolute: 0 K/uL (ref 0.0–0.5)
Eosinophils Relative: 0 %
HCT: 39.1 % (ref 36.0–46.0)
Hemoglobin: 13.2 g/dL (ref 12.0–15.0)
Immature Granulocytes: 1 %
Lymphocytes Relative: 9 %
Lymphs Abs: 0.9 K/uL (ref 0.7–4.0)
MCH: 32.4 pg (ref 26.0–34.0)
MCHC: 33.8 g/dL (ref 30.0–36.0)
MCV: 95.8 fL (ref 80.0–100.0)
Monocytes Absolute: 0.8 K/uL (ref 0.1–1.0)
Monocytes Relative: 8 %
Neutro Abs: 8.4 K/uL — ABNORMAL HIGH (ref 1.7–7.7)
Neutrophils Relative %: 82 %
Platelets: 265 K/uL (ref 150–400)
RBC: 4.08 MIL/uL (ref 3.87–5.11)
RDW: 12 % (ref 11.5–15.5)
WBC: 10.2 K/uL (ref 4.0–10.5)
nRBC: 0 % (ref 0.0–0.2)

## 2024-07-01 MED ORDER — LACTATED RINGERS IV BOLUS
1000.0000 mL | Freq: Once | INTRAVENOUS | Status: AC
  Administered 2024-07-02: 1000 mL via INTRAVENOUS

## 2024-07-01 NOTE — ED Provider Notes (Signed)
 [place] EMERGENCY DEPARTMENT AT [facility] Provider Note   CSN: [id number] Arrival date & time: 07/01/24  2239     History {Add pertinent medical, surgical, social history, OB history to HPI:1} Chief Complaint  Patient presents with   Dizziness   Fall    Heather Roberson is a 73 y.o. female with HTN, rheumatoid arthritis, breast cancer in remission status post radiation therapy who presents with fall, generalized weakness, dizziness.  Patient was found on the floor by her daughter x after about 2 hours.  Patient states that she is very dehydrated.  She states she feels very weak when she is dehydrated and she tries to drink well but does not drink very well.  She states she does feel short of breath as well. She endorses that she has felt short of breath for a while but cannot give an exact date.  States she has to think about it.  Throughout the interview she is very irritable with her daughter when her daughter tries to help give history.  She denies any nausea vomiting abdominal pain, chest pain, flulike symptoms.  Endorses some leg swelling but cannot give me a timeframe. No BM x 3-4 days. Hx of constipation.  Daughter at bedside states that patient is acting normally.   Past Medical History:  Diagnosis Date   Arthritis    History of radiation therapy    Left breast- 04/20/23-05/20/23- Dr. Lynwood Nasuti   Hypertension        Home Medications Prior to Admission medications   Medication Sig Start Date End Date Taking? Authorizing Provider  acetaminophen  (TYLENOL ) 500 MG tablet Take 500 mg by mouth every 6 (six) hours as needed for moderate pain.    [provider]  amLODipine (NORVASC) 2.5 MG tablet Take 2.5 mg by mouth daily.    [provider]  ascorbic acid (VITAMIN C) 500 MG tablet Take 500 mg by mouth daily.    [provider]  Calcium Carb-Cholecalciferol (CALCIUM 600+D) 600-10 MG-MCG TABS Take 1 tablet by mouth daily at 6 (six)  AM.    [provider]  CALCIUM PO Take 1 tablet by mouth daily.    [provider]  carbamide peroxide (DEBROX) 6.5 % OTIC solution Place 5 drops into both ears 2 (two) times daily. 12/03/19   Arloa Suzen RAMAN, NP  Carboxymethylcellul-Glycerin (CLEAR EYES FOR DRY EYES OP) Place 1-2 drops into both eyes 2 (two) times daily as needed (for dryness).    [provider]  cholecalciferol (VITAMIN D) 1000 units tablet Take 1,000 Units by mouth daily.    [provider]  ferrous sulfate 325 (65 FE) MG tablet Take 325 mg by mouth daily with breakfast.    [provider]  fluticasone (FLONASE) 50 MCG/ACT nasal spray Place 1-2 sprays into both nostrils daily as needed for allergies or rhinitis.    [provider]  hydroxychloroquine (PLAQUENIL) 200 MG tablet Take 200 mg by mouth daily. 02/05/16   [provider]  levocetirizine (XYZAL) 5 MG tablet Take 5 mg by mouth every evening. 02/10/22   [provider]  mirtazapine (REMERON) 7.5 MG tablet Take 7.5 mg by mouth at bedtime.    [provider]  omega-3 acid ethyl esters (LOVAZA) 1 g capsule Take 1 g by mouth daily.    [provider]  predniSONE (DELTASONE) 5 MG tablet Take 5 mg by mouth daily as needed (For pain).    [provider]  traMADol  (  ULTRAM ) 50 MG tablet Take 1 tablet (50 mg total) by mouth every 6 (six) hours as needed for moderate pain or severe pain. 03/03/23   Belinda Cough, MD      Allergies    Other    Review of Systems   Review of Systems A 10 point review of systems was performed and is negative unless otherwise reported in HPI.  Physical Exam Updated Vital Signs BP 97/65   Pulse 97   Temp 97.7 F (36.5 C) (Oral)   Resp (!) 22   Ht 5' 2 (1.575 m)   Wt 47 kg   SpO2 100%   BMI 18.95 kg/m  Physical Exam General: Generally weak appearing elderly female, lying in bed.  HEENT: PERRLA, Sclera anicteric, dry mucous membranes,  trachea midline.  Cardiology: Tachycardic regular rate, no murmurs/rubs/gallops. BL radial and DP pulses equal bilaterally.  Resp: Mildly tachypneic.  CTAB, no wheezes, rhonchi, crackles.  Abd: Soft, non-tender, non-distended. No rebound tenderness or guarding.  GU: Deferred. MSK: No peripheral edema or signs of trauma. Extremities without deformity or TTP. No cyanosis or clubbing. Skin: warm, dry. No rashes or lesions. Back: No CVA tenderness Neuro: A&Ox4, CNs II-XII grossly intact. MAEs. Sensation grossly intact.  Psych: Normal mood and affect.   ED Results / Procedures / Treatments   Labs (all labs ordered are listed, but only abnormal results are displayed) Labs Reviewed  CBC WITH DIFFERENTIAL/PLATELET - Abnormal; Notable for the following components:      Result Value   Neutro Abs 8.4 (*)    All other components within normal limits  COMPREHENSIVE METABOLIC PANEL WITH GFR - Abnormal; Notable for the following components:   CO2 17 (*)    Glucose, Bld 101 (*)    Anion gap 16 (*)    All other components within normal limits  URINALYSIS, ROUTINE W REFLEX MICROSCOPIC - Abnormal; Notable for the following components:   APPearance CLOUDY (*)    Hgb urine dipstick SMALL (*)    Ketones, ur 5 (*)    Leukocytes,Ua SMALL (*)    Bacteria, UA MANY (*)    All other components within normal limits  D-DIMER, QUANTITATIVE - Abnormal; Notable for the following components:   D-Dimer, Quant 2.94 (*)    All other components within normal limits  I-STAT CHEM 8, ED - Abnormal; Notable for the following components:   Potassium 3.4 (*)    Calcium, Ion 1.07 (*)    TCO2 19 (*)    Hemoglobin 11.6 (*)    HCT 34.0 (*)    All other components within normal limits  CULTURE, BLOOD (ROUTINE X 2)  CULTURE, BLOOD (ROUTINE X 2)  LIPASE, BLOOD  CK  BRAIN NATRIURETIC PEPTIDE  CBG MONITORING, ED  I-STAT CG4 LACTIC ACID, ED  TROPONIN I (HIGH SENSITIVITY)  TROPONIN I (HIGH SENSITIVITY)     EKG None  Radiology CT PE: No evidence of pulmonary emboli. No acute abnormality noted.    Procedures Procedures  {Document cardiac monitor, telemetry assessment procedure when appropriate:1}  Medications Ordered in ED Medications  lactated ringers  bolus 1,000 mL (0 mLs Intravenous Stopped 07/02/24 0355)  iohexol  (OMNIPAQUE ) 350 MG/ML injection 50 mL (50 mLs Intravenous Contrast Given 07/02/24 0055)  diazepam  (VALIUM ) injection 2 mg (2 mg Intravenous Given 07/02/24 0147)    ED Course/ Medical Decision Making/ A&P                          Medical  Decision Making Amount and/or Complexity of Data Reviewed Labs: ordered. Decision-making details documented in ED Course. Radiology: ordered. Decision-making details documented in ED Course.  Risk Prescription drug management.    This patient presents to the ED for concern of ***, this involves an extensive number of treatment options, and is a complaint that carries with it a high risk of complications and morbidity.  I considered the following differential and admission for this acute, potentially life threatening condition.   MDM:    DDX for dyspnea includes but is not limited to:    Cardiac- CHF, Myocardial Ischemia, Valvular heart disease, Arrhythmia, Cardiac tamponade   Respiratory - Pneumonia / atelectasis / pulmonary effusion / cavitary lung disease, Pneumothorax, COPD/ reactive airway disease, PE Other - Sepsis, Anemia     Clinical Course as of 07/04/24 2302  Fri Jul 01, 2024  2344 WBC: 10.2 No leukocytosis but with mild left shift [HN]  Sat Jul 02, 2024  0048 Lipase: 33 neg [HN]  0048 D-Dimer, Quant(!): 2.94 CT PE already ordered [HN]  0136 CT Angio Chest PE W and/or Wo Contrast [HN]  0136 CT Angio Chest PE W and/or Wo Contrast No evidence of pulmonary emboli.  No acute abnormality noted.   [HN]  0136 Lactic Acid, Venous: 1.3 wnl [HN]  0136 CK Total: 80 wnl [HN]  0139 Discussed with Delon RN  who is concerned about patient anxiety.  Patient has reassuring workup thus far but seems to be very concerned about her daughter and granddaughter at bedside as well as other things related to her life and seems to be working herself up.  Will trial diazepam  for anxiety to see if this helps patient's dyspnea as well. [HN]  0140 Pending troponin and BNP [HN]  0141 B Natriuretic Peptide: 8.2 neg [HN]  0514 Reevaluated patient.  She was sleeping and feels improved.  I had treated her with 1 L of fluids and Valium .  She is not in any respiratory distress with no tachypnea.  I discussed with her about possible anxiety as a cause.  So encouraged her to stay well-hydrated.  Instructed her to follow-up with her primary care physician within 1 week.  Given discharge instructions and return precautions, all questions answered to patient satisfaction. [HN]    Clinical Course User Index [HN] Franklyn Sid SAILOR, MD    Labs: I Ordered, and personally interpreted labs.  The pertinent results include: Those listed above  Imaging Studies ordered: I ordered imaging studies including CT PE I independently visualized and interpreted imaging. I agree with the radiologist interpretation  Additional history obtained from chart review, daughter and granddaughter at bedside.    Cardiac Monitoring: The patient was maintained on a cardiac monitor.  I personally viewed and interpreted the cardiac monitored which showed an underlying rhythm of: Sinus tachycardia  Reevaluation: After the interventions noted above, I reevaluated the patient and found that they have :improved  Social Determinants of Health:  lives with daughter  Disposition:  DC w/ discharge instructions/return precautions. All questions answered to patient's satisfaction.     Co morbidities that complicate the patient evaluation  Past Medical History:  Diagnosis Date   Arthritis    History of radiation therapy    Left breast- 04/20/23-05/20/23-  Dr. Lynwood Nasuti   Hypertension      Medicines Meds ordered this encounter  Medications   lactated ringers  bolus 1,000 mL   iohexol  (OMNIPAQUE ) 350 MG/ML injection 50 mL   DISCONTD: LORazepam  (ATIVAN ) tablet 0.5 mg  diazepam  (VALIUM ) injection 2 mg    I have reviewed the patients home medicines and have made adjustments as needed  Problem List / ED Course: Problem List Items Addressed This Visit   None Visit Diagnoses       Dizziness    -  Primary     Dyspnea, unspecified type         Dehydration         Anxiety       Relevant Medications   diazepam  (VALIUM ) injection 2 mg (Completed)     Generalized weakness                {Document critical care time when appropriate:1} {Document review of labs and clinical decision tools ie heart score, Chads2Vasc2 etc:1}  {Document your independent review of radiology images, and any outside records:1} {Document your discussion with family members, caretakers, and with consultants:1} {Document social determinants of health affecting pt's care:1} {Document your decision making why or why not admission, treatments were needed:1}  This note was created using dictation software, which may contain spelling or grammatical errors.

## 2024-07-01 NOTE — ED Triage Notes (Signed)
 Found on the floor by family x about 1-2 hours.  Reports felt dizzy and slid out of bed (no head strike). Now just feels weak all over. No BM x 3-4 days. Hx of constipation.

## 2024-07-02 ENCOUNTER — Emergency Department (HOSPITAL_COMMUNITY)

## 2024-07-02 LAB — COMPREHENSIVE METABOLIC PANEL WITH GFR
ALT: 26 U/L (ref 0–44)
AST: 31 U/L (ref 15–41)
Albumin: 3.7 g/dL (ref 3.5–5.0)
Alkaline Phosphatase: 55 U/L (ref 38–126)
Anion gap: 16 — ABNORMAL HIGH (ref 5–15)
BUN: 16 mg/dL (ref 8–23)
CO2: 17 mmol/L — ABNORMAL LOW (ref 22–32)
Calcium: 9.2 mg/dL (ref 8.9–10.3)
Chloride: 102 mmol/L (ref 98–111)
Creatinine, Ser: 0.95 mg/dL (ref 0.44–1.00)
GFR, Estimated: >60 mL/min
Glucose, Bld: 101 mg/dL — ABNORMAL HIGH (ref 70–99)
Potassium: 3.7 mmol/L (ref 3.5–5.1)
Sodium: 135 mmol/L (ref 135–145)
Total Bilirubin: 0.8 mg/dL (ref 0.0–1.2)
Total Protein: 7.9 g/dL (ref 6.5–8.1)

## 2024-07-02 LAB — URINALYSIS, ROUTINE W REFLEX MICROSCOPIC
Bilirubin Urine: NEGATIVE
Glucose, UA: NEGATIVE mg/dL
Ketones, ur: 5 mg/dL — AB
Nitrite: NEGATIVE
Protein, ur: NEGATIVE mg/dL
Specific Gravity, Urine: 1.01 (ref 1.005–1.030)
pH: 6 (ref 5.0–8.0)

## 2024-07-02 LAB — I-STAT CHEM 8, ED
BUN: 15 mg/dL (ref 8–23)
Calcium, Ion: 1.07 mmol/L — ABNORMAL LOW (ref 1.15–1.40)
Chloride: 103 mmol/L (ref 98–111)
Creatinine, Ser: 0.9 mg/dL (ref 0.44–1.00)
Glucose, Bld: 88 mg/dL (ref 70–99)
HCT: 34 % — ABNORMAL LOW (ref 36.0–46.0)
Hemoglobin: 11.6 g/dL — ABNORMAL LOW (ref 12.0–15.0)
Potassium: 3.4 mmol/L — ABNORMAL LOW (ref 3.5–5.1)
Sodium: 135 mmol/L (ref 135–145)
TCO2: 19 mmol/L — ABNORMAL LOW (ref 22–32)

## 2024-07-02 LAB — I-STAT CG4 LACTIC ACID, ED: Lactic Acid, Venous: 1.3 mmol/L (ref 0.5–1.9)

## 2024-07-02 LAB — BRAIN NATRIURETIC PEPTIDE: B Natriuretic Peptide: 8.2 pg/mL (ref 0.0–100.0)

## 2024-07-02 LAB — D-DIMER, QUANTITATIVE: D-Dimer, Quant: 2.94 ug{FEU}/mL — ABNORMAL HIGH (ref 0.00–0.50)

## 2024-07-02 LAB — TROPONIN I (HIGH SENSITIVITY)
Troponin I (High Sensitivity): 8 ng/L (ref <18)
Troponin I (High Sensitivity): 11 ng/L (ref <18)

## 2024-07-02 LAB — LIPASE, BLOOD: Lipase: 33 U/L (ref 11–51)

## 2024-07-02 LAB — CK: Total CK: 80 U/L (ref 38–234)

## 2024-07-02 MED ORDER — LORAZEPAM 1 MG PO TABS
0.5000 mg | ORAL_TABLET | Freq: Once | ORAL | Status: DC
Start: 2024-07-02 — End: 2024-07-02

## 2024-07-02 MED ORDER — DIAZEPAM 5 MG/ML IJ SOLN
2.0000 mg | Freq: Once | INTRAMUSCULAR | Status: AC
  Administered 2024-07-02: 2 mg via INTRAVENOUS
  Filled 2024-07-02: qty 2

## 2024-07-02 MED ORDER — IOHEXOL 350 MG/ML SOLN
50.0000 mL | Freq: Once | INTRAVENOUS | Status: AC | PRN
  Administered 2024-07-02: 50 mL via INTRAVENOUS

## 2024-07-02 NOTE — ED Notes (Signed)
 Pt becoming very anxious, unable to answer questions by staff, hyperventilating. Pt asking for O2. Pt oxygen sat at 100% on room air. Pt placed on 1L [place] for support. MD notified of pts anxiety.

## 2024-07-02 NOTE — Discharge Instructions (Signed)
 Thank you for coming to [facility] Emergency Department. You were seen for dehydration, generalized weakness, shortness of breath, and anxiety.  You were treated with fluids and anxiety medication in the emergency department and improved.  Your workup was very reassuring including a CT scan of your chest.  Please stay well-hydrated at home. Please follow up with your primary care provider within 1 week.   Do not hesitate to return to the ED or call 911 if you experience: -Worsening symptoms -Chest pain -Lightheadedness, passing out -Fevers/chills -Anything else that concerns you

## 2024-07-07 LAB — CULTURE, BLOOD (ROUTINE X 2)
Culture: NO GROWTH
Culture: NO GROWTH
Special Requests: ADEQUATE
